# Patient Record
Sex: Female | Born: 1995 | Race: Black or African American | Hispanic: No | Marital: Single | Smoking: Never smoker
Health system: Southern US, Community
[De-identification: ages and names within clinical notes are randomized; demographics above are authoritative.]

## PROBLEM LIST (undated history)

## (undated) DIAGNOSIS — J45909 Unspecified asthma, uncomplicated: Secondary | ICD-10-CM

## (undated) HISTORY — PX: TONSILLECTOMY: SUR1361

---

## 2018-03-26 ENCOUNTER — Other Ambulatory Visit: Payer: Self-pay

## 2018-03-26 ENCOUNTER — Encounter (HOSPITAL_COMMUNITY): Payer: Self-pay | Admitting: Emergency Medicine

## 2018-03-26 ENCOUNTER — Emergency Department (HOSPITAL_COMMUNITY)
Admission: EM | Admit: 2018-03-26 | Discharge: 2018-03-26 | Disposition: A | Discharge status: Home / Self Care | Payer: Medicaid - Out of State | Attending: Emergency Medicine | Admitting: Emergency Medicine

## 2018-03-26 DIAGNOSIS — J45909 Unspecified asthma, uncomplicated: Secondary | ICD-10-CM | POA: Insufficient documentation

## 2018-03-26 DIAGNOSIS — D649 Anemia, unspecified: Secondary | ICD-10-CM | POA: Insufficient documentation

## 2018-03-26 DIAGNOSIS — M26603 Bilateral temporomandibular joint disorder, unspecified: Secondary | ICD-10-CM | POA: Diagnosis not present

## 2018-03-26 DIAGNOSIS — K0889 Other specified disorders of teeth and supporting structures: Secondary | ICD-10-CM

## 2018-03-26 HISTORY — DX: Unspecified asthma, uncomplicated: J45.909

## 2018-03-26 LAB — CBC WITH DIFFERENTIAL/PLATELET
ABS IMMATURE GRANULOCYTES: 0 10*3/uL (ref 0.0–0.1)
BASOS ABS: 0 10*3/uL (ref 0.0–0.1)
Basophils Relative: 1 %
Eosinophils Absolute: 0.6 10*3/uL (ref 0.0–0.7)
Eosinophils Relative: 10 %
HEMATOCRIT: 33.5 % — AB (ref 36.0–46.0)
Hemoglobin: 10.3 g/dL — ABNORMAL LOW (ref 12.0–15.0)
IMMATURE GRANULOCYTES: 0 %
LYMPHS PCT: 30 %
Lymphs Abs: 1.9 10*3/uL (ref 0.7–4.0)
MCH: 24 pg — ABNORMAL LOW (ref 26.0–34.0)
MCHC: 30.7 g/dL (ref 30.0–36.0)
MCV: 78.1 fL (ref 78.0–100.0)
Monocytes Absolute: 0.5 10*3/uL (ref 0.1–1.0)
Monocytes Relative: 7 %
NEUTROS ABS: 3.5 10*3/uL (ref 1.7–7.7)
NEUTROS PCT: 52 %
Platelets: 393 10*3/uL (ref 150–400)
RBC: 4.29 MIL/uL (ref 3.87–5.11)
RDW: 16.3 % — ABNORMAL HIGH (ref 11.5–15.5)
WBC: 6.5 10*3/uL (ref 4.0–10.5)

## 2018-03-26 LAB — T4, FREE: FREE T4: 0.78 ng/dL — AB (ref 0.82–1.77)

## 2018-03-26 LAB — TSH: TSH: 2.47 u[IU]/mL (ref 0.350–4.500)

## 2018-03-26 MED ORDER — PENICILLIN V POTASSIUM 500 MG PO TABS: 500.0000 mg | ORAL_TABLET | Freq: Three times a day (TID) | ORAL | 0 refills | Status: DC

## 2018-03-26 MED ORDER — ACETAMINOPHEN 325 MG PO TABS
650.0000 mg | ORAL_TABLET | ORAL | Status: DC | PRN
  Administered 2018-03-26: 650 mg via ORAL
  Filled 2018-03-26: qty 2

## 2018-03-26 NOTE — ED Provider Notes (Addendum)
[facility] EMERGENCY DEPARTMENT Provider Note   CSN: [id number] Arrival date & time: 03/26/18  4098  History   Chief Complaint Chief Complaint  Patient presents with  . Dental Pain   HPI  Debra Santana is a 22 y.o. female with a medical history of pseudoseizures and asthma who presented to the ED for intermittent dental pain. Patient states that pain started under her chin a few months ago, but has worsened over the past couple of weeks and is now present in her jaws bilaterally. She describes the pain as intermittent and throbbing. She states that it occasionally wakes her up at night and can radiate to her ears and cause a headache. Pain is relieved with ibuprofen. She took  ibuprofen prior to arrival to the ED. Patient has not been evaluated by another medical provider or dentist prior to coming to the ED.   Denies fever, chills, weight changes and heat/cold intolerance. Denies sore throat, dysphagia, difficulty speaking, drooling and halitosis. Endorses neck pain and fatigue. Denies past or family history of thyroid disease. Patient states that she has had asthma and past history of "psych related" seizures for which she does not take medications and does not follow a provider outpatient.   Patient lives in [place], [place] and will be following up there for medical care. She currently does not have a PCP or dentist.  The history is provided by the patient.  Dental Pain   This is a recurrent problem. Episode onset: a few months ago. The problem has been gradually worsening. The pain is at a severity of 8/10. The pain is moderate. She has tried rest, ice and heat for the symptoms. The treatment provided mild relief.    Past Medical History:  Diagnosis Date  . Asthma     Patient Active Problem List   Diagnosis Date Noted  . Odontalgia 03/26/2018     OB History   None      Home Medications    Prior to Admission medications   Not on File    Family  History No family history on file.  Social History Social History   Tobacco Use  . Smoking status: Never Smoker  . Smokeless tobacco: Never Used  Substance Use Topics  . Alcohol use: Not Currently    Frequency: Never  . Drug use: Not Currently     Allergies   Patient has no allergy information on record.   Review of Systems Review of Systems  Constitutional: Positive for appetite change and fever. Negative for activity change, chills, diaphoresis, fatigue and unexpected weight change.       Mostly eats soft foods because of pain associated with chewing. Endorses fever when she awakes at night.  HENT: Positive for dental problem, ear pain and facial swelling. Negative for drooling, hearing loss, mouth sores, sinus pressure, sinus pain, sore throat, trouble swallowing and voice change.   Eyes: Negative.  Negative for pain and visual disturbance.  Respiratory: Negative.  Negative for cough, choking, chest tightness, shortness of breath and wheezing.   Cardiovascular: Negative.   Endocrine: Negative.  Negative for cold intolerance and heat intolerance.  Skin: Negative.  Negative for rash and wound.  Neurological: Positive for headaches. Negative for seizures and speech difficulty.       Headaches when dental pain is at its worse.   Hematological: Negative.  Negative for adenopathy. Does not bruise/bleed easily.   Physical Exam Updated Vital Signs BP 126/62   Pulse 78  Temp 98.5 F (36.9 C) (Oral)   Ht  (1.854 m)   Wt 127 kg (280 lb)   LMP 03/12/2018 (Approximate)   SpO2 99%   BMI 36.94 kg/m   Physical Exam  Constitutional: She is oriented to person, place, and time. Vital signs are normal. She appears well-developed. She is cooperative.  Non-toxic appearance. She does not have a sickly appearance. No distress.  Obese.  HENT:  Head: Normocephalic and atraumatic.  Right Ear: Hearing, tympanic membrane, external ear and ear canal normal. No drainage, swelling or  tenderness. No mastoid tenderness. No middle ear effusion.  Left Ear: Hearing, tympanic membrane, external ear and ear canal normal. No drainage, swelling or tenderness. No mastoid tenderness.  No middle ear effusion.  Nose: Nose normal.  Mouth/Throat: Uvula is midline, oropharynx is clear and moist and mucous membranes are normal. No oral lesions. Normal dentition. No dental abscesses, uvula swelling or dental caries. No tonsillar exudate.  Tenderness to palpation in TMJ region. No visible lesions, dental caries or abscess seen.  Eyes: Pupils are equal, round, and reactive to light. Conjunctivae, EOM and lids are normal.  Neck: Trachea normal and phonation normal. Normal carotid pulses present. No tracheal tenderness present. Carotid bruit is not present. No tracheal deviation present.  Cardiovascular: Normal rate, regular rhythm and normal heart sounds.  Lymphadenopathy:       Left: Supraclavicular adenopathy present.  Single, mobile, tender supraclavicular lymph node noted on left side.  Neurological: She is alert and oriented to person, place, and time. She has normal strength. No cranial nerve deficit or sensory deficit.  Skin: Skin is warm, dry and intact.  Nursing note and vitals reviewed.   ED Treatments / Results  Labs (all labs ordered are listed, but only abnormal results are displayed) Labs Reviewed  CBC WITH DIFFERENTIAL/PLATELET - Abnormal; Notable for the following components:      Result Value   Hemoglobin 10.3 (*)    HCT 33.5 (*)    MCH 24.0 (*)    RDW 16.3 (*)    All other components within normal limits  T4, FREE - Abnormal; Notable for the following components:   Free T4 0.78 (*)    All other components within normal limits  TSH    EKG None  Radiology No results found.  Procedures Procedures (including critical care time)  Medications Ordered in ED Medications  acetaminophen (TYLENOL) tablet 650 mg (650 mg Oral Given 03/26/18 0748)    Initial  Impression / Assessment and Plan / ED Course  Triage vital signs and the nursing notes have been reviewed.  Pertinent labs & imaging results that were available during care of the patient were reviewed and considered in medical decision making (see chart for details).  Patient presents with intermittent, throbbing mouth pain. Physical exam does not show any signs of acute dental infections or structural defects. Patient's dentition is good without signs of caries, abscess or gum disease. Although teeth are mildly tender to palpation. She endorses that the pain radiates to her neck, jaw, ears and head and has awaken her at night. Endorses tenderness over TMJ and underneath jaws bilaterally on exam today. Endorses fever at night. Denies dysphagia, drooling, dyspnea or changes in speech. No signs or symptoms of temporal arteritis or Ludwig's angina.  CBC with differential ordered to evaluate for infection which showed anemia with hemoglobin of 10.3 with normal WBC. Patient has given Tylenol  for acute pain and experienced relief.   Final Clinical Impressions(s) /  ED Diagnoses   1. Unspecified Dental Pain. Recommend follow-up with PCP and/or dentist for continued treatment. Prophylactic PCN given. Patient can continue ibuprofen and/or acetaminophen for pain relief. Given dental resources in discharge paperwork. Patient educated on appropriate follow-up and pharm and non-pharm treatment.  2. Anemia. Hemoglobin 10.3. Denies lightheadedness, dizziness, melena/hematochezia and other signs of acute blood loss. Recommend follow-up with PCP for monitoring.  After thorough clinical evaluation, this patient is determined to be medically stable and can be safely discharged with the previously mentioned treatment and/or outpatient follow-up/referral(s). At this time, there are no other apparent medical conditions that require further screening, evaluation or treatment.  Final diagnoses:  Bilateral  temporomandibular joint pain    ED Discharge Orders    None       Reva Bores 03/26/18 1610    Windy Carina, PA-C 03/26/18 0857    Windy Carina, PA-C 03/26/18 [phone]

## 2018-03-26 NOTE — ED Triage Notes (Signed)
Pt arriving from home. She reports dental pain on left and right side and feels like her jaw is swollen.  Pt reports the pain has been going on for a few months but has progressively gotten worse in the last week. Pt took 800 mg of  ibuprofen before her arrival.

## 2018-03-26 NOTE — ED Provider Notes (Signed)
Medical screening examination/treatment/procedure(s) were conducted as a shared visit with non-physician practitioner(s) and myself.  I personally evaluated the patient during the encounter.  None  Patient is a 22 year old female who presents to the emergency department with dental pain mostly in bilateral lower teeth.  Feels like she has had some swelling underneath her jaw but no difficulty swallowing, speaking or breathing.  No enlargement of her thyroid.  Reports subjective fevers at home.  No tenderness over the TMJ, temporal artery.  No symptoms to suggest trigeminal neuralgia.  Afebrile here.  Actually well-appearing.  Normal voice.  No trismus or drooling.  Tender over the bilateral lower molars without obvious sign of dental caries or dental abscess.  Will discharge on prophylactic penicillin and recommend alternating Tylenol and Motrin for pain and give dental resources.   Amahri Dengel, Layla Maw, DO 03/26/18 [phone]

## 2018-03-26 NOTE — ED Notes (Signed)
Pt walked to bathroom 

## 2018-03-27 DIAGNOSIS — K0889 Other specified disorders of teeth and supporting structures: Secondary | ICD-10-CM | POA: Insufficient documentation

## 2018-03-27 NOTE — ED Triage Notes (Signed)
Pt reports dental pain on left lower side. She is tearful in triage. States that she has been taking ibuprofen all day, but is still in pain. Reports the pain has been there for a few weeks, but is worse today. A&Ox4.

## 2018-03-28 ENCOUNTER — Encounter (HOSPITAL_COMMUNITY): Payer: Self-pay

## 2018-03-28 ENCOUNTER — Emergency Department (HOSPITAL_COMMUNITY)
Admission: EM | Admit: 2018-03-28 | Discharge: 2018-03-28 | Disposition: A | Discharge status: Home / Self Care | Payer: Medicaid - Out of State | Attending: Emergency Medicine | Admitting: Emergency Medicine

## 2018-03-28 ENCOUNTER — Other Ambulatory Visit: Payer: Self-pay

## 2018-03-28 DIAGNOSIS — K0889 Other specified disorders of teeth and supporting structures: Secondary | ICD-10-CM

## 2018-03-28 MED ORDER — PENICILLIN V POTASSIUM 500 MG PO TABS: 500.0000 mg | ORAL_TABLET | Freq: Three times a day (TID) | ORAL | 0 refills | Status: AC

## 2018-03-28 MED ORDER — PENICILLIN V POTASSIUM 500 MG PO TABS
500.0000 mg | ORAL_TABLET | Freq: Once | ORAL | Status: AC
  Administered 2018-03-28: 500 mg via ORAL
  Filled 2018-03-28: qty 1

## 2018-03-28 MED ORDER — HYDROCODONE-ACETAMINOPHEN 5-325 MG PO TABS
1.0000 | ORAL_TABLET | Freq: Once | ORAL | Status: AC
  Administered 2018-03-28: 1 via ORAL
  Filled 2018-03-28: qty 1

## 2018-03-28 NOTE — ED Notes (Signed)
PA at bedside.

## 2018-03-28 NOTE — ED Provider Notes (Signed)
[facility]-EMERGENCY DEPT Provider Note   CSN: [id number] Arrival date & time: 03/27/18  2308     History   Chief Complaint No chief complaint on file.   HPI Debra Santana is a 22 y.o. female with a hx of asthma presents to the Emergency Department complaining of gradual, persistent, progressively worsening left lower dental pain onset approximately 8 PM tonight.  Patient reports the pain worsened around 10 PM.  She states she took Tylenol and ibuprofen without relief.  Patient reports she has not seen a dentist in many years.  She denies known dental caries.  Eating and drinking make her symptoms worse.  Nothing seems to make them better.  She denies headache, neck pain, chest pain, shortness of breath, abdominal pain, nausea, vomiting, diarrhea.  Record review shows that patient was seen on 03/26/2018 for similar symptoms.  At that time she was prescribed penicillin.  On further questioning, patient reports that she does not get paid until today and therefore was unable to fill her prescription.   The history is provided by the patient and medical records. No language interpreter was used.    Past Medical History:  Diagnosis Date  . Asthma     Patient Active Problem List   Diagnosis Date Noted  . Odontalgia 03/26/2018    Past Surgical History:  Procedure Laterality Date  . TONSILLECTOMY       OB History   None      Home Medications    Prior to Admission medications   Medication Sig Start Date End Date Taking? Authorizing Provider  penicillin v potassium (VEETID) 500 MG tablet Take 1 tablet (500 mg total) by mouth 3 (three) times daily for 5 days. 03/28/18 04/02/18  Danyka Merlin, Dahlia Client, PA-C    Family History History reviewed. No pertinent family history.  Social History Social History   Tobacco Use  . Smoking status: Never Smoker  . Smokeless tobacco: Never Used  Substance Use Topics  . Alcohol use: Not Currently    Frequency: Never    . Drug use: Not Currently     Allergies   Patient has no known allergies.   Review of Systems Review of Systems  Constitutional: Negative for appetite change, chills and fever.  HENT: Positive for dental problem. Negative for drooling, ear pain, facial swelling, nosebleeds, postnasal drip, rhinorrhea and trouble swallowing.   Eyes: Negative for pain and redness.  Respiratory: Negative for cough and wheezing.   Cardiovascular: Negative for chest pain.  Gastrointestinal: Negative for abdominal pain, nausea and vomiting.  Musculoskeletal: Negative for neck pain and neck stiffness.  Skin: Negative for color change and rash.  Neurological: Negative for weakness, light-headedness and headaches.  All other systems reviewed and are negative.    Physical Exam Updated Vital Signs BP 120/79 (BP Location: Right Arm)   Pulse 84   Temp 98.3 F (36.8 C)   Resp 17   LMP 03/12/2018 (Approximate)   SpO2 98%   Physical Exam  Constitutional: She appears well-developed and well-nourished.  HENT:  Head: Normocephalic.  Right Ear: Tympanic membrane, external ear and ear canal normal.  Left Ear: Tympanic membrane, external ear and ear canal normal.  Nose: Nose normal. Right sinus exhibits no maxillary sinus tenderness and no frontal sinus tenderness. Left sinus exhibits no maxillary sinus tenderness and no frontal sinus tenderness.  Mouth/Throat: Uvula is midline, oropharynx is clear and moist and mucous membranes are normal. No oral lesions. Abnormal dentition. Dental caries present. No uvula swelling  or lacerations. No oropharyngeal exudate, posterior oropharyngeal edema, posterior oropharyngeal erythema or tonsillar abscesses.  Left lower molar with tenderness to palpation. No gross abscess No fluctuance or induration to the buccal mucosa or floor of the mouth  Eyes: Pupils are equal, round, and reactive to light. Conjunctivae are normal. Right eye exhibits no discharge. Left eye exhibits no  discharge.  Neck: Normal range of motion. Neck supple.  No stridor Handling secretions without difficulty No nuchal rigidity No cervical lymphadenopathy  Cardiovascular: Normal rate and regular rhythm.  Pulmonary/Chest: Effort normal. No respiratory distress.  Equal chest rise  Abdominal: Soft. She exhibits no distension. There is no tenderness.  Lymphadenopathy:    She has no cervical adenopathy.  Neurological: She is alert.  Skin: Skin is warm and dry.  Psychiatric: She has a normal mood and affect.  Nursing note and vitals reviewed.    ED Treatments / Results   Procedures Procedures (including critical care time)  Medications Ordered in ED Medications  HYDROcodone-acetaminophen (NORCO/VICODIN) 5-325 MG per tablet 1 tablet (has no administration in time range)  penicillin v potassium (VEETID) tablet 500 mg (has no administration in time range)     Initial Impression / Assessment and Plan / ED Course  I have reviewed the triage vital signs and the nursing notes.  Pertinent labs & imaging results that were available during my care of the patient were reviewed by me and considered in my medical decision making (see chart for details).     Patient with toothache.  No gross abscess.  Exam unconcerning for Ludwig's angina or spread of infection.  Will treat with penicillin (repeat rx as she does not have the first one) and anti-inflammatories medicine.  Urged patient to follow-up with dentist.     Final Clinical Impressions(s) / ED Diagnoses   Final diagnoses:  Dentalgia    ED Discharge Orders        Ordered    penicillin v potassium (VEETID) 500 MG tablet  3 times daily     03/28/18 0459       Gumaro Brightbill, Dahlia Client, PA-C 03/28/18 0500    Zadie Rhine, MD 03/28/18 [phone]

## 2018-03-28 NOTE — Discharge Instructions (Addendum)
1. Medications: penicillin, usual home medications °2. Treatment: rest, drink plenty of fluids, take medications as prescribed °3. Follow Up: Please followup with dentistry within 1 week for discussion of your diagnoses and further evaluation after today's visit; if you do not have a primary care doctor use the resource guide provided to find one; Return to the ER for high fevers, difficulty breathing, difficulty swallowing or other concerning symptoms ° °

## 2018-04-09 ENCOUNTER — Emergency Department (HOSPITAL_COMMUNITY)
Admission: EM | Admit: 2018-04-09 | Discharge: 2018-04-09 | Disposition: A | Discharge status: Home / Self Care | Payer: Medicaid - Out of State | Attending: Emergency Medicine | Admitting: Emergency Medicine

## 2018-04-09 ENCOUNTER — Other Ambulatory Visit: Payer: Self-pay

## 2018-04-09 ENCOUNTER — Encounter (HOSPITAL_COMMUNITY): Payer: Self-pay | Admitting: *Deleted

## 2018-04-09 ENCOUNTER — Emergency Department (HOSPITAL_COMMUNITY): Payer: Medicaid - Out of State

## 2018-04-09 DIAGNOSIS — R0602 Shortness of breath: Secondary | ICD-10-CM

## 2018-04-09 DIAGNOSIS — K0889 Other specified disorders of teeth and supporting structures: Secondary | ICD-10-CM

## 2018-04-09 DIAGNOSIS — J45909 Unspecified asthma, uncomplicated: Secondary | ICD-10-CM | POA: Diagnosis not present

## 2018-04-09 LAB — CBC WITH DIFFERENTIAL/PLATELET
Abs Immature Granulocytes: 0 10*3/uL (ref 0.0–0.1)
Basophils Absolute: 0.1 10*3/uL (ref 0.0–0.1)
Basophils Relative: 1 %
Eosinophils Absolute: 0.5 10*3/uL (ref 0.0–0.7)
Eosinophils Relative: 9 %
HCT: 33.5 % — ABNORMAL LOW (ref 36.0–46.0)
Hemoglobin: 10.3 g/dL — ABNORMAL LOW (ref 12.0–15.0)
Immature Granulocytes: 0 %
Lymphocytes Relative: 36 %
Lymphs Abs: 2.1 10*3/uL (ref 0.7–4.0)
MCH: 23.7 pg — ABNORMAL LOW (ref 26.0–34.0)
MCHC: 30.7 g/dL (ref 30.0–36.0)
MCV: 77 fL — ABNORMAL LOW (ref 78.0–100.0)
Monocytes Absolute: 0.4 10*3/uL (ref 0.1–1.0)
Monocytes Relative: 8 %
Neutro Abs: 2.7 10*3/uL (ref 1.7–7.7)
Neutrophils Relative %: 46 %
Platelets: 410 10*3/uL — ABNORMAL HIGH (ref 150–400)
RBC: 4.35 MIL/uL (ref 3.87–5.11)
RDW: 15.9 % — ABNORMAL HIGH (ref 11.5–15.5)
WBC: 5.8 10*3/uL (ref 4.0–10.5)

## 2018-04-09 LAB — BASIC METABOLIC PANEL
Anion gap: 6 (ref 5–15)
BUN: 6 mg/dL (ref 6–20)
CO2: 24 mmol/L (ref 22–32)
Calcium: 9.1 mg/dL (ref 8.9–10.3)
Chloride: 107 mmol/L (ref 101–111)
Creatinine, Ser: 0.85 mg/dL (ref 0.44–1.00)
GFR calc Af Amer: >60 mL/min (ref >60)
GFR calc non Af Amer: >60 mL/min (ref >60)
Glucose, Bld: 86 mg/dL (ref 65–99)
Potassium: 3.9 mmol/L (ref 3.5–5.1)
Sodium: 137 mmol/L (ref 135–145)

## 2018-04-09 LAB — I-STAT BETA HCG BLOOD, ED (MC, WL, AP ONLY): I-stat hCG, quantitative: <5 m[IU]/mL (ref <5)

## 2018-04-09 MED ORDER — ALBUTEROL SULFATE HFA 108 (90 BASE) MCG/ACT IN AERS
1.0000 | INHALATION_SPRAY | Freq: Once | RESPIRATORY_TRACT | Status: AC
  Administered 2018-04-09: 1 via RESPIRATORY_TRACT
  Filled 2018-04-09: qty 6.7

## 2018-04-09 MED ORDER — KETOROLAC TROMETHAMINE 30 MG/ML IJ SOLN
30.0000 mg | Freq: Once | INTRAMUSCULAR | Status: AC
  Administered 2018-04-09: 30 mg via INTRAVENOUS
  Filled 2018-04-09: qty 1

## 2018-04-09 MED ORDER — IOHEXOL 300 MG/ML  SOLN
75.0000 mL | Freq: Once | INTRAMUSCULAR | Status: AC | PRN
  Administered 2018-04-09: 75 mL via INTRAVENOUS

## 2018-04-09 MED ORDER — IPRATROPIUM-ALBUTEROL 0.5-2.5 (3) MG/3ML IN SOLN
3.0000 mL | Freq: Once | RESPIRATORY_TRACT | Status: AC
  Administered 2018-04-09: 3 mL via RESPIRATORY_TRACT
  Filled 2018-04-09: qty 3

## 2018-04-09 MED ORDER — SODIUM CHLORIDE 0.9 % IV BOLUS
1000.0000 mL | Freq: Once | INTRAVENOUS | Status: AC
Start: 2018-04-09 — End: 2018-04-09
  Administered 2018-04-09: 1000 mL via INTRAVENOUS

## 2018-04-09 NOTE — ED Notes (Signed)
EDP states okay for patient to eat/drink - provided [place] sandwich and ice water.

## 2018-04-09 NOTE — ED Triage Notes (Signed)
Pt is here with 2 abscesses on the bilateral lower mouth and thinks it has spread to head and neck area.  Pt states it is making it hard for her to breath.  Pt is talking in complete sentences, pt states she wakes up in pain with some drooling.  Pt states she cannot taste or smell anything.  Pt states it has been this bad for a couple of weeks.  No active distress

## 2018-04-09 NOTE — ED Provider Notes (Signed)
[facility] EMERGENCY DEPARTMENT Provider Note   CSN: [id number] Arrival date & time: 04/09/18  1731     History   Chief Complaint No chief complaint on file.   HPI Debra Santana is a 22 y.o. female with history of asthma presents for evaluation of acute onset, personally worsening mandibular dental pain for 2 to 3 weeks.  She has been seen twice in the past for this, once on 03/26/2018 and again on 03/28/2018.  She initially could not afford the prescription of penicillin she was prescribed on the 15th but did fill it after the 17th and has completed the 7-day course she notes ongoing constant throbbing left-sided mandibular pain which has since radiated to the right and "all over my sinus ".  She notes a constant frontal throbbing headache for the past 2 weeks as well as intermittent sharp stabbing pain primarily behind the left eye but sometimes behind the right eye.  She also notes pain with movement of the left eye.  She denies vision changes.  She states that for the past 2 weeks she has had decreased sense of smell and taste.  She has nasal congestion at baseline due to seasonal allergies.  Over the past few days she has noted facial swelling of to the left side of the face under the jaw.  She denies dysphagia or diet aphasia but states that when she awakens in the morning she will sometimes drool.  For the past 3 to 4 days she endorses intermittent shortness of breath primarily with exertion.  She is unsure if she has been wheezing but states she has been out of her albuterol inhaler for several months.  Pain worsens with eating and drinking.  She denies fevers, throat tightness, voice change, or chest pain. She has also been trying warm compresses and Tylenol without significant relief of her symptoms.  She has an appointment scheduled with a dentist in [place] in 2 days.  The history is provided by the patient.    Past Medical History:  Diagnosis Date  . Asthma      Patient Active Problem List   Diagnosis Date Noted  . Odontalgia 03/26/2018    Past Surgical History:  Procedure Laterality Date  . TONSILLECTOMY       OB History   None      Home Medications    Prior to Admission medications   Not on File    Family History No family history on file.  Social History Social History   Tobacco Use  . Smoking status: Never Smoker  . Smokeless tobacco: Never Used  Substance Use Topics  . Alcohol use: Not Currently    Frequency: Never  . Drug use: Not Currently     Allergies   Patient has no known allergies.   Review of Systems Review of Systems  Constitutional: Negative for chills and fever.  HENT: Positive for congestion, dental problem, drooling and facial swelling. Negative for sore throat, trouble swallowing and voice change.   Eyes: Positive for pain. Negative for photophobia, discharge, redness and visual disturbance.  Respiratory: Positive for shortness of breath. Negative for cough.   Cardiovascular: Negative for chest pain.  Gastrointestinal: Negative for abdominal pain, nausea and vomiting.  Musculoskeletal: Positive for neck pain.  Neurological: Positive for headaches.  All other systems reviewed and are negative.    Physical Exam Updated Vital Signs BP 130/71 (BP Location: Right Arm)   Pulse 71   Temp 98.4 F (36.9 C) (Oral)  Resp 16   LMP 03/12/2018 (Approximate)   SpO2 99%   Physical Exam  Constitutional: She is oriented to person, place, and time. She appears well-developed and well-nourished. No distress.  HENT:  Head: Atraumatic.  Dentition appears stable.  Mild dental decay throughout but no fractured or missing dentition.  Mild gingival irritation with no observable abscess.  The patient has mild trismus but is able to open her mouth 2-3 finger widths.  No sublingual swelling or tenderness.  No posterior oropharyngeal erythema, tonsillar hypertrophy, exudates, or uvular deviation.  The  dentition is not tender to percussion.  TMs without erythema or bulging bilaterally.  Nasal septum is midline with mild mucosal edema bilaterally.  No maxillary sinus tenderness but bilateral frontal sinuses are tender to palpation.  Patient does have tenderness to palpation of the left orbit as well as the left mandible and the angle of the jaw.  She has mild left submandibular swelling with tenderness to palpation.  Eyes: Pupils are equal, round, and reactive to light. Conjunctivae are normal. Right eye exhibits no discharge. Left eye exhibits no discharge.  Patient endorses pain with EOMs of the left eye in every direction.  There is no conjunctival injection, chemosis, proptosis, or consensual photophobia.  No drainage.  No erythema or swelling of the eyelids.  The left orbit is tender to palpation generally.  Neck: Normal range of motion. Neck supple. No JVD present. No tracheal deviation present.  Pain is elicited with flexion of the neck and the left anterior aspect of the neck is tender to palpation.  No thyromegaly.  No upper airway stridor  Cardiovascular: Normal rate, regular rhythm, normal heart sounds and intact distal pulses.  Pulmonary/Chest: Effort normal.  Equal rise and fall of chest.  No increased work of breathing.  Globally diminished breath sounds with no wheezes on auscultation.  Speaking in full sentences without difficulty.  Abdominal: Soft. Bowel sounds are normal. She exhibits no distension. There is no tenderness. There is no guarding.  Musculoskeletal: She exhibits no edema.  Neurological: She is alert and oriented to person, place, and time. No cranial nerve deficit or sensory deficit. She exhibits normal muscle tone.  Skin: Skin is warm and dry. No erythema.  Psychiatric: She has a normal mood and affect. Her behavior is normal.  Nursing note and vitals reviewed.    ED Treatments / Results  Labs (all labs ordered are listed, but only abnormal results are  displayed) Labs Reviewed  CBC WITH DIFFERENTIAL/PLATELET - Abnormal; Notable for the following components:      Result Value   Hemoglobin 10.3 (*)    HCT 33.5 (*)    MCV 77.0 (*)    MCH 23.7 (*)    RDW 15.9 (*)    Platelets 410 (*)    All other components within normal limits  BASIC METABOLIC PANEL  I-STAT BETA HCG BLOOD, ED ([facility], [facility], [facility] ONLY)    EKG None  Radiology Ct Soft Tissue Neck W Contrast  Result Date: 04/09/2018 CLINICAL DATA:  Periorbital, mouth and neck abscess for months. Toothache. Difficulty breathing. History of tonsillectomy. EXAM: CT NECK WITH CONTRAST TECHNIQUE: Multidetector CT imaging of the neck was performed using the standard protocol following the bolus administration of intravenous contrast. CONTRAST:  75mL OMNIPAQUE IOHEXOL 300 MG/ML  SOLN COMPARISON:  None. FINDINGS: Large body habitus results in overall noisy image quality. PHARYNX AND LARYNX: Normal.  Widely patent airway. SALIVARY GLANDS: Normal. THYROID: Normal. LYMPH NODES: No lymphadenopathy by CT size  criteria. VASCULAR: Normal. LIMITED INTRACRANIAL: Empty sella. VISUALIZED ORBITS: Normal. MASTOIDS AND VISUALIZED PARANASAL SINUSES: Mild paranasal sinus mucosal thickening. SKELETON: Nonacute. Scattered dental caries without periapical abscess. UPPER CHEST: Lung apices are clear. Mild probable residual versus reactivated thymic tissue. No superior mediastinal lymphadenopathy. OTHER: None. IMPRESSION: 1. Habitus limited examination without acute process in the neck. 2. Scattered dental caries without periapical abscess. Electronically Signed   By: Awilda Metro M.D.   On: 04/09/2018 21:35    Procedures Procedures (including critical care time)  Medications Ordered in ED Medications  ipratropium-albuterol (DUONEB) 0.5-2.5 (3) MG/3ML nebulizer solution 3 mL (3 mLs Nebulization Given 04/09/18 1929)  sodium chloride 0.9 % bolus 1,000 mL (0 mLs Intravenous Stopped 04/09/18 2146)  ketorolac (TORADOL) 30 MG/ML  injection 30 mg (30 mg Intravenous Given 04/09/18 1937)  iohexol (OMNIPAQUE) 300 MG/ML solution 75 mL (75 mLs Intravenous Contrast Given 04/09/18 2053)  albuterol (PROVENTIL HFA;VENTOLIN HFA) 108 (90 Base) MCG/ACT inhaler 1 puff (1 puff Inhalation Given 04/09/18 2215)     Initial Impression / Assessment and Plan / ED Course  I have reviewed the triage vital signs and the nursing notes.  Pertinent labs & imaging results that were available during my care of the patient were reviewed by me and considered in my medical decision making (see chart for details).     Patient presents for evaluation of dental pain for the past 2 weeks.  She has been seen and evaluated for this twice before.  She is afebrile, vital signs are stable.  She is complaining of intermittent drooling and facial swelling but is tolerating secretions without difficulty and is protecting her airway.  There is no upper airway stridor.  She does have globally diminished breath sounds which improved significantly with DuoNeb.  On reevaluation the patient tells me she is feeling much better with regards to her shortness of breath.  I do suspect mild asthma exacerbation secondary to her upper airway symptoms.  The patient has been out of her albuterol inhaler and we will refill this for her.  With regards to her dental pain, the pain appears to radiate to the left side of the face and she has pain with movements of the right eye.  No fever or meningeal signs to suggest meningitis.  We will obtain CT scan to rule out deep space neck infection or orbital cellulitis.  Lab work reviewed by me shows no leukocytosis, mild stable anemia, and no electrolyte abnormalities.  CT scan shows no evidence of acute processes within the neck, scattered dental caries without periapical abscess.  No evidence of orbital cellulitis.  On reevaluation, the patient is resting comfortably no apparent distress.  She is eating a [place] sandwich and drinking a soda.  She  has no difficulty with this.  She does have follow-up scheduled with a dentist in 2 days.  Discussed conservative management with ibuprofen and Tylenol and encouraged the patient to follow-up with the PCP with regards to her asthma.  She has no increased work of breathing on my exam.  Encouraged the patient to keep her appointment with her dentist in 2 days.  Discussed strict ED return precautions. Pt verbalized understanding of and agreement with plan and is safe for discharge home at this time.   Final Clinical Impressions(s) / ED Diagnoses   Final diagnoses:  Pain, dental  SOB (shortness of breath)    ED Discharge Orders    None       Michela Pitcher A, PA-C 04/10/18 0111  Benjiman Core, MD 04/11/18 Marlyne Beards

## 2018-04-09 NOTE — ED Notes (Signed)
Patient verbalized understanding of discharge instructions and denies any further needs or questions at this time. VS stable. Patient ambulatory with steady gait.  

## 2018-04-09 NOTE — Discharge Instructions (Signed)
Alternate 600 mg of ibuprofen and [phone] mg of Tylenol every 3 hours as needed for pain. Do not exceed 4000 mg of Tylenol daily.  Take ibuprofen with food to avoid upset stomach issues.  Use your albuterol inhaler as needed for shortness of breath.  Follow-up with your dentist in 2 days as scheduled for reevaluation.  Return to the emergency department if any concerning signs or symptoms develop such as worsening facial swelling, high fevers, or difficulty breathing.

## 2018-07-21 ENCOUNTER — Ambulatory Visit (INDEPENDENT_AMBULATORY_CARE_PROVIDER_SITE_OTHER): Payer: Self-pay | Admitting: Physician Assistant

## 2018-07-21 ENCOUNTER — Encounter (INDEPENDENT_AMBULATORY_CARE_PROVIDER_SITE_OTHER): Payer: Self-pay | Admitting: Physician Assistant

## 2018-07-21 ENCOUNTER — Other Ambulatory Visit: Payer: Self-pay

## 2018-07-21 VITALS — BP 116/77 | HR 83 | Temp 98.8°F | Ht 73.0 in | Wt 348.0 lb

## 2018-07-21 DIAGNOSIS — R21 Rash and other nonspecific skin eruption: Secondary | ICD-10-CM

## 2018-07-21 DIAGNOSIS — Z23 Encounter for immunization: Secondary | ICD-10-CM

## 2018-07-21 MED ORDER — TRIAMCINOLONE ACETONIDE 0.5 % EX OINT: 1.0000 "application " | TOPICAL_OINTMENT | Freq: Two times a day (BID) | CUTANEOUS | 0 refills | Status: DC

## 2018-07-21 NOTE — Progress Notes (Signed)
Subjective:  Patient ID: Debra Santana, female    DOB: 03-19-1996  Age: 22 y.o. MRN: [id number]  CC: rash  HPI Debra Santana is a 22 y.o. female with a medical history of asthma presents as a new patient with complaint of rash in the antecubital fossa since approximately two years ago. Was seen by her PCP and has tried several creams to include steroids but none have been helpful. Was prescribed a pill that was initially helpful but was ineffective on subsequent refills. Does not remember the name to the pill. Says there is mild itching at times. Thinks she may have some similar lesions on the upper back but does not feel itching. Does not have any other symptoms or complaints.        Outpatient Medications Prior to Visit  Medication Sig Dispense Refill  . fluticasone (FLONASE) 50 MCG/ACT nasal spray Place 2 sprays into both nostrils daily.     No facility-administered medications prior to visit.      ROS Review of Systems  Constitutional: Negative for chills, fever and malaise/fatigue.  Eyes: Negative for blurred vision.  Respiratory: Negative for shortness of breath.   Cardiovascular: Negative for chest pain and palpitations.  Gastrointestinal: Negative for abdominal pain and nausea.  Genitourinary: Negative for dysuria and hematuria.  Musculoskeletal: Negative for joint pain and myalgias.  Skin: Positive for itching and rash.  Neurological: Negative for tingling and headaches.  Psychiatric/Behavioral: Negative for depression. The patient is not nervous/anxious.     Objective:  BP 116/77 (BP Location: Right Arm, Patient Position: Sitting, Cuff Size: Large)   Pulse 83   Temp 98.8 F (37.1 C) (Oral)   Ht 6\' 1"  (1.854 m)   Wt (!) 348 lb (157.9 kg)   LMP 06/25/2018 (Approximate)   SpO2 95%   BMI 45.91 kg/m   BP/Weight 07/21/2018 04/09/2018 03/28/2018  Systolic BP 116 130 134  Diastolic BP 77 71 80  Wt. (Lbs) 348 - -  BMI 45.91 - -      Physical Exam   Constitutional: She is oriented to person, place, and time.  Well developed, morbidly obese, NAD, polite  HENT:  Head: Normocephalic and atraumatic.  Eyes: No scleral icterus.  Neck: Normal range of motion. Neck supple. No thyromegaly present.  Cardiovascular: Normal rate, regular rhythm and normal heart sounds.  Pulmonary/Chest: Effort normal and breath sounds normal.  Musculoskeletal: She exhibits no edema.  Neurological: She is alert and oriented to person, place, and time.  Skin: Skin is warm and dry. No rash noted. No erythema. No pallor.  Right antecubital fossa with well circumscribed, rounded, hyperpigmented lesions containing overlying fine scale; no papules, edema, suppuration, or surrounding erythema. Left antecubital fossa with the same type of lesions but these lesions are converged.   Psychiatric: She has a normal mood and affect. Her behavior is normal. Thought content normal.  Vitals reviewed.    Assessment & Plan:   1. Rash and nonspecific skin eruption - Begin triamcinolone ointment (KENALOG) 0.5 %; Apply 1 application topically 2 (two) times daily.  Dispense: 30 g; Refill: 0 - Advised to apply for CAFA in case dermatology referral would be necessary.  2. Need for Tdap vaccination - Tdap vaccine greater than or equal to 7yo IM   Meds ordered this encounter  Medications  . triamcinolone ointment (KENALOG) 0.5 %    Sig: Apply 1 application topically 2 (two) times daily.    Dispense:  30 g    Refill:  0  Order Specific Question:   Supervising Provider    Answer:   Hoy Register [[id number]]    Follow-up: Return if symptoms worsen or fail to improve.   Loletta Specter PA

## 2018-07-21 NOTE — Patient Instructions (Addendum)
Triamcinolone skin cream, ointment, lotion, or aerosol What is this medicine? TRIAMCINOLONE (trye am SIN oh lone) is a corticosteroid. It is used on the skin to reduce swelling, redness, itching, and allergic reactions. This medicine may be used for other purposes; ask your health care provider or pharmacist if you have questions. COMMON BRAND NAME(S): Aristocort, Aristocort A, Aristocort HP, Cinalog, Cinolar, DERMASORB TA Complete, Flutex, Kenalog, Pediaderm TA, SP Rx 228, Triacet, Trianex, Triderm What should I tell my health care provider before I take this medicine? They need to know if you have any of these conditions: -diabetes -infection, like tuberculosis, herpes, or fungal infection -large areas of burned or damaged skin -skin wasting or thinning -an unusual or allergic reaction to triamcinolone, corticosteroids, other medicines, foods, dyes, or preservatives -pregnant or trying to get pregnant -breast-feeding How should I use this medicine? This medicine is for external use only. Do not take by mouth. Follow the directions on the prescription label. Wash your hands before and after use. Apply a thin film of medicine to the affected area. Do not cover with a bandage or dressing unless your doctor or health care professional tells you to. Do not use on healthy skin or over large areas of skin. Do not get this medicine in your eyes. If you do, rinse out with plenty of cool tap water. It is important not to use more medicine than prescribed. Do not use your medicine more often than directed. Talk to your pediatrician regarding the use of this medicine in children. Special care may be needed. Elderly patients are more likely to have damaged skin through aging, and this may increase side effects. This medicine should only be used for brief periods and infrequently in older patients. Overdosage: If you think you have taken too much of this medicine contact a poison control center or emergency  room at once. NOTE: This medicine is only for you. Do not share this medicine with others. What if I miss a dose? If you miss a dose, use it as soon as you can. If it is almost time for your next dose, use only that dose. Do not use double or extra doses. What may interact with this medicine? Interactions are not expected. This list may not describe all possible interactions. Give your health care provider a list of all the medicines, herbs, non-prescription drugs, or dietary supplements you use. Also tell them if you smoke, drink alcohol, or use illegal drugs. Some items may interact with your medicine. What should I watch for while using this medicine? Tell your doctor or health care professional if your symptoms do not start to get better within one week. Do not use for more than 14 days. Do not use on healthy skin or over large areas of skin. Tell your doctor or health care professional if you are exposed to anyone with measles or chickenpox, or if you develop sores or blisters that do not heal properly. Do not use an airtight bandage to cover the affected area unless your doctor or health care professional tells you to. If you are to cover the area, follow the instructions carefully. Covering the area where the medicine is applied can increase the amount that passes through the skin and increases the risk of side effects. If treating the diaper area of a child, avoid covering the treated area with tight-fitting diapers or plastic pants. This may increase the amount of medicine that passes through the skin and increase the risk of serious side   effects. What side effects may I notice from receiving this medicine? Side effects that you should report to your doctor or health care professional as soon as possible: -burning or itching of the skin -dark red spots on the skin -infection -painful, red, pus filled blisters in hair follicles -thinning of the skin, sunburn more likely especially on the  face Side effects that usually do not require medical attention (report to your doctor or health care professional if they continue or are bothersome): -dry skin, irritation -unusual increased growth of hair on the face or body This list may not describe all possible side effects. Call your doctor for medical advice about side effects. You may report side effects to FDA at 1-800-FDA-1088. Where should I keep my medicine? Keep out of the reach of children. Store at room temperature between 15 and 30 degrees C (59 and 86 degrees F). Do not freeze. Throw away any unused medicine after the expiration date. NOTE: This sheet is a summary. It may not cover all possible information. If you have questions about this medicine, talk to your doctor, pharmacist, or health care provider.  2018 Elsevier/Gold Standard (2014-02-18 15:59:51)   Td Vaccine (Tetanus and Diphtheria): What You Need to Know 1. Why get vaccinated? Tetanus  and diphtheria are very serious diseases. They are rare in the [place] today, but people who do become infected often have severe complications. Td vaccine is used to protect adolescents and adults from both of these diseases. Both tetanus and diphtheria are infections caused by bacteria. Diphtheria spreads from person to person through coughing or sneezing. Tetanus-causing bacteria enter the body through cuts, scratches, or wounds. TETANUS (lockjaw) causes painful muscle tightening and stiffness, usually all over the body.  It can lead to tightening of muscles in the head and neck so you can't open your mouth, swallow, or sometimes even breathe. Tetanus kills about 1 out of every 10 people who are infected even after receiving the best medical care.  DIPHTHERIA can cause a thick coating to form in the back of the throat.  It can lead to breathing problems, paralysis, heart failure, and death.  Before vaccines, as many as 200,000 cases of diphtheria and hundreds of cases of  tetanus were reported in the [place] each year. Since vaccination began, reports of cases for both diseases have dropped by about 99%. 2. Td vaccine Td vaccine can protect adolescents and adults from tetanus and diphtheria. Td is usually given as a booster dose every 10 years but it can also be given earlier after a severe and dirty wound or burn. Another vaccine, called Tdap, which protects against pertussis in addition to tetanus and diphtheria, is sometimes recommended instead of Td vaccine. Your doctor or the person giving you the vaccine can give you more information. Td may safely be given at the same time as other vaccines. 3. Some people should not get this vaccine  A person who has ever had a life-threatening allergic reaction after a previous dose of any tetanus or diphtheria containing vaccine, OR has a severe allergy to any part of this vaccine, should not get Td vaccine. Tell the person giving the vaccine about any severe allergies.  Talk to your doctor if you: ? had severe pain or swelling after any vaccine containing diphtheria or tetanus, ? ever had a condition called Guillain Barre Syndrome (GBS), ? aren't feeling well on the day the shot is scheduled. 4. What are the risks from Td vaccine? With any  medicine, including vaccines, there is a chance of side effects. These are usually mild and go away on their own. Serious reactions are also possible but are rare. Most people who get Td vaccine do not have any problems with it. Mild problems following Td vaccine: (Did not interfere with activities)  Pain where the shot was given (about 8 people in 10)  Redness or swelling where the shot was given (about 1 person in 4)  Mild fever (rare)  Headache (about 1 person in 4)  Tiredness (about 1 person in 4)  Moderate problems following Td vaccine: (Interfered with activities, but did not require medical attention)  Fever over 102F (rare)  Severe problems following Td  vaccine: (Unable to perform usual activities; required medical attention)  Swelling, severe pain, bleeding and/or redness in the arm where the shot was given (rare).  Problems that could happen after any vaccine:  People sometimes faint after a medical procedure, including vaccination. Sitting or lying down for about 15 minutes can help prevent fainting, and injuries caused by a fall. Tell your doctor if you feel dizzy, or have vision changes or ringing in the ears.  Some people get severe pain in the shoulder and have difficulty moving the arm where a shot was given. This happens very rarely.  Any medication can cause a severe allergic reaction. Such reactions from a vaccine are very rare, estimated at fewer than 1 in a million doses, and would happen within a few minutes to a few hours after the vaccination. As with any medicine, there is a very remote chance of a vaccine causing a serious injury or death. The safety of vaccines is always being monitored. For more information, visit: http://floyd.org/ 5. What if there is a serious reaction? What should I look for? Look for anything that concerns you, such as signs of a severe allergic reaction, very high fever, or unusual behavior. Signs of a severe allergic reaction can include hives, swelling of the face and throat, difficulty breathing, a fast heartbeat, dizziness, and weakness. These would usually start a few minutes to a few hours after the vaccination. What should I do?  If you think it is a severe allergic reaction or other emergency that can't wait, call 9-1-1 or get the person to the nearest hospital. Otherwise, call your doctor.  Afterward, the reaction should be reported to the Vaccine Adverse Event Reporting System (VAERS). Your doctor might file this report, or you can do it yourself through the VAERS web site at www.vaers.LAgents.no, or by calling 1-380-155-2875. ? VAERS does not give medical advice. 6. The National  Vaccine Injury Compensation Program The [employer] Vaccine Injury Compensation Program (VICP) is a federal program that was created to compensate people who may have been injured by certain vaccines. Persons who believe they may have been injured by a vaccine can learn about the program and about filing a claim by calling 1-6398326529 or visiting the VICP website at SpiritualWord.at. There is a time limit to file a claim for compensation. 7. How can I learn more?  Ask your doctor. He or she can give you the vaccine package insert or suggest other sources of information.  Call your local or state health department.  Contact the Centers for Disease Control and Prevention (CDC): ? Call (928)406-5203 (1-800-CDC-INFO) ? Visit CDC's website at PicCapture.uy CDC Td Vaccine VIS (02/21/16) This information is not intended to replace advice given to you by your health care provider. Make sure you discuss any questions you  have with your health care provider. Document Released: 08/26/2006 Document Revised: 07/19/2016 Document Reviewed: 07/19/2016 Elsevier Interactive Patient Education  2017 [employer].

## 2018-08-08 ENCOUNTER — Telehealth (INDEPENDENT_AMBULATORY_CARE_PROVIDER_SITE_OTHER): Payer: Self-pay | Admitting: Physician Assistant

## 2018-08-08 NOTE — Telephone Encounter (Signed)
Patient called requesting medication refill for   fluticasone (FLONASE) 50 MCG/ACT nasal spray   Please Advice  [phone]  Thank you Louisa Second

## 2018-08-12 ENCOUNTER — Other Ambulatory Visit (INDEPENDENT_AMBULATORY_CARE_PROVIDER_SITE_OTHER): Payer: Self-pay | Admitting: Physician Assistant

## 2018-08-12 MED ORDER — FLUTICASONE PROPIONATE 50 MCG/ACT NA SUSP: 2.0000 | Freq: Every day | NASAL | 1 refills | Status: DC

## 2018-08-12 NOTE — Telephone Encounter (Signed)
FWD to PCP. Audrie Kuri S Janaki Exley, CMA  

## 2018-08-12 NOTE — Telephone Encounter (Signed)
Refill sent to [facility] °

## 2018-09-11 ENCOUNTER — Other Ambulatory Visit: Payer: Self-pay

## 2018-09-11 ENCOUNTER — Emergency Department (HOSPITAL_COMMUNITY)
Admission: EM | Admit: 2018-09-11 | Discharge: 2018-09-11 | Disposition: A | Discharge status: Home / Self Care | Payer: Medicaid - Out of State | Attending: Emergency Medicine | Admitting: Emergency Medicine

## 2018-09-11 ENCOUNTER — Encounter (HOSPITAL_COMMUNITY): Payer: Self-pay

## 2018-09-11 DIAGNOSIS — Z79899 Other long term (current) drug therapy: Secondary | ICD-10-CM | POA: Insufficient documentation

## 2018-09-11 DIAGNOSIS — J069 Acute upper respiratory infection, unspecified: Secondary | ICD-10-CM

## 2018-09-11 DIAGNOSIS — J45909 Unspecified asthma, uncomplicated: Secondary | ICD-10-CM | POA: Diagnosis not present

## 2018-09-11 DIAGNOSIS — R51 Headache: Secondary | ICD-10-CM | POA: Diagnosis present

## 2018-09-11 NOTE — ED Provider Notes (Signed)
[facility] EMERGENCY DEPARTMENT Provider Note   CSN: [id number] Arrival date & time: 09/11/18  1439     History   Chief Complaint Chief Complaint  Patient presents with  . URI    HPI Debra Santana is a 22 y.o. female who presents with URI symptoms. PMH significant for asthma and allergies. She states her symptoms started yesterday. She reports nasal congestion and rhinorrhea as well as a throat scratchiness and odynophagia. No fever but has had chills. She has had a mild headache. She has been using Flonase for allergies. No ear pain, cough, vomiting. She is concerned that her symptoms may be due to mold in the place where she works.  HPI  Past Medical History:  Diagnosis Date  . Asthma     Patient Active Problem List   Diagnosis Date Noted  . Odontalgia 03/26/2018    Past Surgical History:  Procedure Laterality Date  . TONSILLECTOMY       OB History   None      Home Medications    Prior to Admission medications   Medication Sig Start Date End Date Taking? Authorizing Provider  fluticasone (FLONASE) 50 MCG/ACT nasal spray Place 2 sprays into both nostrils daily. 08/12/18   Loletta Specter, PA-C  triamcinolone ointment (KENALOG) 0.5 % Apply 1 application topically 2 (two) times daily. 07/21/18   Loletta Specter, PA-C    Family History No family history on file.  Social History Social History   Tobacco Use  . Smoking status: Never Smoker  . Smokeless tobacco: Never Used  Substance Use Topics  . Alcohol use: Not Currently    Frequency: Never  . Drug use: Not Currently     Allergies   Patient has no known allergies.   Review of Systems Review of Systems  Constitutional: Positive for chills. Negative for fever.  HENT: Positive for congestion, rhinorrhea and sore throat. Negative for ear pain.   Respiratory: Negative for cough.      Physical Exam Updated Vital Signs BP 127/84 (BP Location: Right Arm)   Pulse 92   Temp  98.6 F (37 C) (Oral)   Resp 16   SpO2 100%   Physical Exam  Constitutional: She is oriented to person, place, and time. She appears well-developed and well-nourished. No distress.  Calm and cooperative. Wearing big sunglasses  HENT:  Head: Normocephalic and atraumatic.  Right Ear: Hearing, tympanic membrane, external ear and ear canal normal.  Left Ear: Hearing, tympanic membrane, external ear and ear canal normal.  Nose: Mucosal edema present.  Mouth/Throat: Uvula is midline, oropharynx is clear and moist and mucous membranes are normal.  Eyes: Pupils are equal, round, and reactive to light. Conjunctivae are normal. Right eye exhibits no discharge. Left eye exhibits no discharge. No scleral icterus.  Neck: Normal range of motion.  Cardiovascular: Normal rate and regular rhythm.  Pulmonary/Chest: Effort normal and breath sounds normal. No respiratory distress.  Abdominal: She exhibits no distension.  Neurological: She is alert and oriented to person, place, and time.  Skin: Skin is warm and dry.  Psychiatric: She has a normal mood and affect. Her behavior is normal.  Nursing note and vitals reviewed.    ED Treatments / Results  Labs (all labs ordered are listed, but only abnormal results are displayed) Labs Reviewed - No data to display  EKG None  Radiology No results found.  Procedures Procedures (including critical care time)  Medications Ordered in ED Medications - No data  to display   Initial Impression / Assessment and Plan / ED Course  I have reviewed the triage vital signs and the nursing notes.  Pertinent labs & imaging results that were available during my care of the patient were reviewed by me and considered in my medical decision making (see chart for details).  22 year old with congestion and sore throat. She is concerned symptoms may be due to mold. She likely has a viral URI. Advised supportive care and allergy medicine.   Final Clinical  Impressions(s) / ED Diagnoses   Final diagnoses:  Upper respiratory tract infection, unspecified type    ED Discharge Orders    None       Bethel Born, PA-C 09/11/18 1531    Raeford Razor, MD 09/11/18 [phone]

## 2018-09-11 NOTE — ED Provider Notes (Signed)
Patient placed in Quick Look pathway, seen and evaluated   Chief Complaint: Nasal congestion  HPI:  Debra Santana is a 22 y.o. female who presents to the ED with sore throat and congestion that started 1 day ago. Patient reports her job has mold issues and she thinks she is reacting to that. Patient reports no cough.  ROS: ENT: nasal congestion, sore throat  Physical Exam:  BP 127/84 (BP Location: Right Arm)   Pulse 92   Temp 98.6 F (37 C) (Oral)   Resp 16   SpO2 100%    Gen: No distress  Neuro: Awake and Alert  Skin: Warm and dry  Lungs: clear  Heart: regular rate and rhythm  ENT: throat with mild erythema  Initiation of care has begun. The patient has been counseled on the process, plan, and necessity for staying for the completion/evaluation, and the remainder of the medical screening examination    Janne Napoleon, NP 09/11/18 1456    Lorre Nick, MD 09/12/18 [phone]

## 2018-09-11 NOTE — Discharge Instructions (Signed)
Please rest and drink plenty of fluids Use over the counter medicines for cold symptoms and allergies Please follow up with your doctor

## 2018-09-11 NOTE — ED Triage Notes (Signed)
Pt presents for evaluation of URI symptoms including nasal congestion, sore throat and chills x 1 day. Reports her job has a mold issue and she thinks she is reacting to it.

## 2018-11-15 ENCOUNTER — Emergency Department (HOSPITAL_COMMUNITY)
Admission: EM | Admit: 2018-11-15 | Discharge: 2018-11-15 | Disposition: A | Discharge status: Home / Self Care | Payer: Commercial Managed Care - PPO | Attending: Emergency Medicine | Admitting: Emergency Medicine

## 2018-11-15 ENCOUNTER — Encounter (HOSPITAL_COMMUNITY): Payer: Self-pay | Admitting: Emergency Medicine

## 2018-11-15 ENCOUNTER — Other Ambulatory Visit: Payer: Self-pay

## 2018-11-15 DIAGNOSIS — Z79899 Other long term (current) drug therapy: Secondary | ICD-10-CM | POA: Insufficient documentation

## 2018-11-15 DIAGNOSIS — J45909 Unspecified asthma, uncomplicated: Secondary | ICD-10-CM | POA: Diagnosis not present

## 2018-11-15 DIAGNOSIS — K0889 Other specified disorders of teeth and supporting structures: Secondary | ICD-10-CM | POA: Diagnosis present

## 2018-11-15 MED ORDER — PENICILLIN V POTASSIUM 500 MG PO TABS: 500.0000 mg | ORAL_TABLET | Freq: Four times a day (QID) | ORAL | 0 refills | Status: AC

## 2018-11-15 MED ORDER — BENZOCAINE 10 % MT GEL: 1.0000 "application " | OROMUCOSAL | 0 refills | Status: DC | PRN

## 2018-11-15 NOTE — ED Triage Notes (Signed)
Dental pain started last night, no obvious dental cavities- does grind teeth at night - pain over TMJ area.

## 2018-11-15 NOTE — Discharge Instructions (Signed)
Please take entire course of antibiotics as directed.  Continue using ibuprofen and Tylenol, as well as prescribed Orajel for pain.  You will need to follow-up with your dentist for continued management of this.  Return to the emergency department for fevers, swelling or pain under the tongue or in the neck, difficulty breathing or swallowing or any other new or concerning symptoms. 

## 2018-11-15 NOTE — ED Provider Notes (Signed)
[facility] EMERGENCY DEPARTMENT Provider Note   CSN: [id number] Arrival date & time: 11/15/18  1626     History   Chief Complaint Chief Complaint  Patient presents with  . Oral Swelling  . Dental Pain    HPI Debra Santana is a 23 y.o. female.  Debra Santana is a 23 y.o. female with a history of asthma, presents for evaluation of dental pain.  Reports pain over bilateral lower posterior molars.  Reports she has had issues with these teeth in the past, but pain became significantly more severe about 2 days ago.  She denies any difficulty swallowing or opening her mouth, no pain or swelling under the tongue.  No fevers or chills, no nausea or vomiting.  She has not noticed any drainage from the surrounding gums.  Reports she has an appointment with her dentist in 6 weeks, has not contacted them since pain worsened for sooner appointment.  Reports several months ago she was on antibiotics for similar pain and it seemed to help.     Past Medical History:  Diagnosis Date  . Asthma     Patient Active Problem List   Diagnosis Date Noted  . Odontalgia 03/26/2018    Past Surgical History:  Procedure Laterality Date  . TONSILLECTOMY       OB History   No obstetric history on file.      Home Medications    Prior to Admission medications   Medication Sig Start Date End Date Taking? Authorizing Provider  fluticasone (FLONASE) 50 MCG/ACT nasal spray Place 2 sprays into both nostrils daily. 08/12/18   Loletta Specter, PA-C  triamcinolone ointment (KENALOG) 0.5 % Apply 1 application topically 2 (two) times daily. 07/21/18   Loletta Specter, PA-C    Family History No family history on file.  Social History Social History   Tobacco Use  . Smoking status: Never Smoker  . Smokeless tobacco: Never Used  Substance Use Topics  . Alcohol use: Not Currently    Frequency: Never  . Drug use: Not Currently     Allergies   Patient has no known  allergies.   Review of Systems Review of Systems  Constitutional: Negative for chills and fever.  HENT: Positive for dental problem. Negative for drooling, facial swelling and trouble swallowing.   Gastrointestinal: Negative for nausea and vomiting.  Musculoskeletal: Negative for neck pain and neck stiffness.  Skin: Negative for color change and rash.     Physical Exam Updated Vital Signs BP (!) 142/80 (BP Location: Left Arm)   Pulse 83   Temp 97.9 F (36.6 C) (Oral)   Resp 16   Ht 6\' 1"  (1.854 m)   Wt 127 kg   LMP 11/09/2018   SpO2 100%   BMI 36.94 kg/m   Physical Exam Vitals signs and nursing note reviewed.  Constitutional:      General: She is not in acute distress.    Appearance: She is well-developed. She is not diaphoretic.  HENT:     Head: Normocephalic and atraumatic.     Mouth/Throat:      Comments: Tenderness over bilateral lower posterior molars with slight erythema of surrounding gums but no obvious abscess, posterior oropharynx is clear and moist, uvula midline, tolerating secretions and normal phonation, no trismus. Eyes:     General:        Right eye: No discharge.        Left eye: No discharge.  Neck:  Comments: No torticollis, no stridor, no palpable tenderness or masses.  Mild lymphadenopathy. Pulmonary:     Effort: Pulmonary effort is normal. No respiratory distress.  Neurological:     Mental Status: She is alert and oriented to person, place, and time. Mental status is at baseline.     Coordination: Coordination normal.  Psychiatric:        Mood and Affect: Mood normal.        Behavior: Behavior normal.      ED Treatments / Results  Labs (all labs ordered are listed, but only abnormal results are displayed) Labs Reviewed - No data to display  EKG None  Radiology No results found.  Procedures Procedures (including critical care time)  Medications Ordered in ED Medications - No data to display   Initial Impression /  Assessment and Plan / ED Course  I have reviewed the triage vital signs and the nursing notes.  Pertinent labs & imaging results that were available during my care of the patient were reviewed by me and considered in my medical decision making (see chart for details).  Patient with toothache.  No gross abscess.  Exam unconcerning for Ludwig's angina or spread of infection.  Will treat with penicillin and anti-inflammatories medicine.  Urged patient to follow-up with dentist.    Final Clinical Impressions(s) / ED Diagnoses   Final diagnoses:  Pain, dental    ED Discharge Orders         Ordered    penicillin v potassium (VEETID) 500 MG tablet  4 times daily     11/15/18 2020    benzocaine (ORAJEL) 10 % mucosal gel  As needed     11/15/18 2020           Dartha LodgeFord, Izyk Marty N, PA-C 11/15/18 2020    Maia PlanLong, Joshua G, MD 11/16/18 [phone]

## 2018-12-16 ENCOUNTER — Other Ambulatory Visit: Payer: Self-pay | Admitting: Family Medicine

## 2018-12-16 DIAGNOSIS — J309 Allergic rhinitis, unspecified: Secondary | ICD-10-CM

## 2018-12-16 MED ORDER — FLUTICASONE PROPIONATE 50 MCG/ACT NA SUSP: 2.0000 | Freq: Every day | NASAL | 0 refills | Status: DC

## 2018-12-16 NOTE — Progress Notes (Signed)
Patient ID: Debra Santana, female   DOB: 1996/10/29, 23 y.o.   MRN: [id number]   Refill request received for refill of Flonase nasal spray.  Patient last seen in the office on 07/21/2018.  We will send in medication with no additional refills and patient will need to schedule follow-up appointment.

## 2019-01-13 ENCOUNTER — Encounter (INDEPENDENT_AMBULATORY_CARE_PROVIDER_SITE_OTHER): Payer: Self-pay | Admitting: Primary Care

## 2019-01-13 ENCOUNTER — Other Ambulatory Visit: Payer: Self-pay

## 2019-01-13 ENCOUNTER — Ambulatory Visit (INDEPENDENT_AMBULATORY_CARE_PROVIDER_SITE_OTHER): Payer: Commercial Managed Care - PPO | Admitting: Primary Care

## 2019-01-13 VITALS — BP 117/70 | HR 87 | Temp 97.9°F | Ht 73.0 in | Wt 357.2 lb

## 2019-01-13 DIAGNOSIS — L309 Dermatitis, unspecified: Secondary | ICD-10-CM | POA: Diagnosis not present

## 2019-01-13 DIAGNOSIS — Z6841 Body Mass Index (BMI) 40.0 and over, adult: Secondary | ICD-10-CM | POA: Diagnosis not present

## 2019-01-13 DIAGNOSIS — T7840XA Allergy, unspecified, initial encounter: Secondary | ICD-10-CM

## 2019-01-13 MED ORDER — SODIUM CHLORIDE 0.9 % IN AERS: 1.0000 | INHALATION_SPRAY | RESPIRATORY_TRACT | 12 refills | Status: DC | PRN

## 2019-01-13 MED ORDER — CETIRIZINE HCL 10 MG PO TABS: 10.0000 mg | ORAL_TABLET | Freq: Every day | ORAL | 11 refills | Status: DC

## 2019-01-13 MED ORDER — FLUCONAZOLE 150 MG PO TABS: 150.0000 mg | ORAL_TABLET | ORAL | 0 refills | Status: AC

## 2019-01-13 NOTE — Progress Notes (Signed)
Established Patient Office Visit  Subjective:  Patient ID: Debra Santana, female    DOB: 02/16/1996  Age: 23 y.o. MRN: [id number]  CC:  Chief Complaint  Patient presents with  . Medication Refill    fluconazole and flonase     HPI Debra Santana presents for originally in for pap initially stated she was a virgin in confidence she asked if just the tip was put in does it break your hymen. She is concern with  Rash on. her arms that only responds to tx of antifungal cream   Past Medical History:  Diagnosis Date  . Asthma     Past Surgical History:  Procedure Laterality Date  . TONSILLECTOMY      History reviewed. No pertinent family history.  Social History   Socioeconomic History  . Marital status: Single    Spouse name: Not on file  . Number of children: Not on file  . Years of education: Not on file  . Highest education level: Not on file  Occupational History  . Not on file  Social Needs  . Financial resource strain: Not on file  . Food insecurity:    Worry: Not on file    Inability: Not on file  . Transportation needs:    Medical: Not on file    Non-medical: Not on file  Tobacco Use  . Smoking status: Never Smoker  . Smokeless tobacco: Never Used  Substance and Sexual Activity  . Alcohol use: Not Currently    Frequency: Never  . Drug use: Not Currently  . Sexual activity: Not Currently  Lifestyle  . Physical activity:    Days per week: Not on file    Minutes per session: Not on file  . Stress: Not on file  Relationships  . Social connections:    Talks on phone: Not on file    Gets together: Not on file    Attends religious service: Not on file    Active member of club or organization: Not on file    Attends meetings of clubs or organizations: Not on file    Relationship status: Not on file  . Intimate partner violence:    Fear of current or ex partner: Not on file    Emotionally abused: Not on file    Physically abused: Not on file   Forced sexual activity: Not on file  Other Topics Concern  . Not on file  Social History Narrative  . Not on file    Outpatient Medications Prior to Visit  Medication Sig Dispense Refill  . fluticasone (FLONASE) 50 MCG/ACT nasal spray Place 2 sprays into both nostrils daily. (Patient not taking: Reported on 01/13/2019) 16 g 0  . benzocaine (ORAJEL) 10 % mucosal gel Use as directed 1 application in the mouth or throat as needed for mouth pain. 5.3 g 0  . triamcinolone ointment (KENALOG) 0.5 % Apply 1 application topically 2 (two) times daily. 30 g 0   No facility-administered medications prior to visit.     No Known Allergies  ROS Review of Systems  Constitutional: Negative.   HENT: Negative.   Eyes: Negative.   Respiratory: Negative.   Cardiovascular: Negative.   Gastrointestinal: Negative.   Endocrine: Negative.   Genitourinary: Negative.   Musculoskeletal: Negative.   Skin: Positive for rash.  Allergic/Immunologic: Negative.   Neurological: Negative.   Hematological: Negative.   Psychiatric/Behavioral: Negative.       Objective:    Physical Exam  Constitutional: She is oriented  to person, place, and time. She appears well-developed and well-nourished.  HENT:  Head: Normocephalic.  Eyes: Pupils are equal, round, and reactive to light. EOM are normal.  Neck: Normal range of motion. Neck supple.  Cardiovascular: Normal rate and regular rhythm.  Pulmonary/Chest: Effort normal and breath sounds normal.  Abdominal: Soft. Bowel sounds are normal.  Musculoskeletal: Normal range of motion.  Neurological: She is alert and oriented to person, place, and time.  Skin: Skin is warm and dry. Ecchymosis and rash noted. Rash is papular and maculopapular.     Psychiatric: She has a normal mood and affect.    BP 117/70 (BP Location: Right Arm, Patient Position: Sitting, Cuff Size: Large)   Pulse 87   Temp 97.9 F (36.6 C) (Oral)   Ht 6\' 1"  (1.854 m)   Wt (!) 357 lb 3.2 oz  (162 kg)   LMP 01/07/2019 (Exact Date)   SpO2 94%   BMI 47.13 kg/m  Wt Readings from Last 3 Encounters:  01/13/19 (!) 357 lb 3.2 oz (162 kg)  11/15/18 280 lb (127 kg)  07/21/18 (!) 348 lb (157.9 kg)     Health Maintenance Due  Topic Date Due  . PAP-Cervical Cytology Screening  10/03/2017  . PAP SMEAR-Modifier  10/03/2017    There are no preventive care reminders to display for this patient.  Lab Results  Component Value Date   TSH 2.470 03/26/2018   Lab Results  Component Value Date   WBC 5.8 04/09/2018   HGB 10.3 (L) 04/09/2018   HCT 33.5 (L) 04/09/2018   MCV 77.0 (L) 04/09/2018   PLT 410 (H) 04/09/2018   Lab Results  Component Value Date   NA 137 04/09/2018   K 3.9 04/09/2018   CO2 24 04/09/2018   GLUCOSE 86 04/09/2018   BUN 6 04/09/2018   CREATININE 0.85 04/09/2018   CALCIUM 9.1 04/09/2018   ANIONGAP 6 04/09/2018      1. Eczema, unspecified type - Allergy Panel, [facility]  2. Morbid obesity (HCC) Diet modification and exercise   3. Allergic state, initial encounter Maintenance antihistamine   Assessment & Plan:       Follow-up: Return in about 6 months (around 07/16/2019), or if symptoms worsen or fail to improve, for morbid obesty .    Grayce Sessions, NP

## 2019-01-13 NOTE — Patient Instructions (Signed)
Preventing Sexually Transmitted Infections, Adult  Sexually transmitted infections (STIs) are diseases that are passed (transmitted) from person to person through bodily fluids exchanged during sex or sexual contact. Bodily fluids include saliva, semen, blood, vaginal mucus, and urine. You may have an increased risk for developing an STI if you have unprotected oral, vaginal, or anal sex.  Some common STIs include:  · Herpes.  · Hepatitis B.  · Chlamydia.  · Gonorrhea.  · Syphilis.  · HPV (human papillomavirus).  · HIV (human immunodeficiency virus), the virus that can cause AIDS (acquired immunodeficiency syndrome).  How can I protect myself from sexually transmitted infections?  The only way to completely prevent STIs is not to have sex of any kind (practice abstinence). This includes oral, vaginal, or anal sex. If you are sexually active, take these actions to lower your risk of getting an STI:  · Have only one sex partner (be monogamous) or limit the number of sexual partners you have.  · Stay up-to-date on immunizations. Certain vaccines can lower your risk of getting certain STIs, such as:  ? Hepatitis A and B vaccines. You may have been vaccinated as a young child, but likely need a booster shot as a teen or young adult.  ? HPV vaccine.  · Use methods that prevent the exchange of body fluids between partners (barrier protection) every time you have sex. Barrier protection can be used during oral, vaginal, or anal sex. Commonly used barrier methods include:  ? Female condom.  ? Female condom.  ? Dental dam.  · Get tested regularly for STIs. Have your sexual partner get tested regularly as well.  · Avoid mixing alcohol, drugs, and sex. Alcohol and drug use can affect your ability to make good decisions and can lead to risky sexual behaviors.  · Ask your health care provider about taking pre-exposure prophylaxis (PrEP) to prevent HIV infection if you:  ? Have a HIV-positive sexual partner.  ? Have multiple sexual  partners or partners who do not know their HIV status, and do not regularly use a condom during sex.  ? Use injection drugs and share needles.  Birth control pills, injections, implants, and intrauterine devices (IUDs) do not protect against STIs. To prevent both STIs and pregnancy, always use a condom with another form of birth control.  Some STIs, such as herpes, are spread through skin to skin contact. A condom does not protect you from getting such STIs. If you or your partner have herpes and there is an active flare with open sores, avoid all sexual contact.  Why are these changes important?  Taking steps to practice safe sex protects you and others. Many STIs can be cured. However, some STIs are not curable and will affect you for the rest of your life. STIs can be passed on to another person even if you do not have symptoms.  What can happen if changes are not made?  Certain STIs may:  · Require you to take medicine for the rest of your life.  · Affect your ability to have children (your fertility).  · Increase your risk for developing another STI or certain serious health conditions, such as:  ? Cervical cancer.  ? Head and neck cancer.  ? Pelvic inflammatory disease (PID) in women.  ? Organ damage or damage to other parts of your body, if the infection spreads.  · Be passed to a baby during childbirth.  How are sexually transmitted infections treated?  If you or   your partner know or think that you may have an STI:  · Talk with your health care provider about what can be done to treat it. Some STIs can be treated and cured with medicines.  · For curable STIs, you and your partner should avoid sex during treatment and for several days after treatment is complete.  · You and your partner should both be treated at the same time, if there is any chance that your partner is infected as well. If you get treatment but your partner does not, your partner can re-infect you when you resume sexual contact.  · Do not  have unprotected sex.  Where to find more information  Learn more about sexually transmitted diseases and infections from:  · Centers for Disease Control and Prevention:  ? More information about specific STIs: www.cdc.gov/std  ? Find places to get sexual health counseling and treatment for free or for a low cost: gettested.cdc.gov  · U.S. Department of Health and Human Services: www.womenshealth.gov/publications/our-publications/fact-sheet/sexually-transmitted-infections.html  Summary  · The only way to completely prevent STIs is not to have sex (practice abstinence), including oral, vaginal, or anal sex.  · STIs can spread through saliva, semen, blood, vaginal mucus, urine, or sexual contact.  · If you do have sex, limit your number of sexual partners and use a barrier protection method every time you have sex.  · If you develop an STI, get treated right away and ask your partner to be treated as well. Do not resume having sex until both of you have completed treatment for the STI.  This information is not intended to replace advice given to you by your health care provider. Make sure you discuss any questions you have with your health care provider.  Document Released: 10/25/2016 Document Revised: 04/04/2018 Document Reviewed: 10/25/2016  Elsevier Interactive Patient Education © 2019 [employer] Inc[employer]

## 2019-01-16 LAB — ALLERGY PANEL, ANIMAL GROUP
Cow Dander IgE: <0.1 kU/L
Goose Feathers IgE: <0.1 kU/L

## 2019-01-28 ENCOUNTER — Encounter (INDEPENDENT_AMBULATORY_CARE_PROVIDER_SITE_OTHER): Payer: Self-pay

## 2019-02-02 ENCOUNTER — Telehealth: Payer: Self-pay | Admitting: Primary Care

## 2019-02-02 ENCOUNTER — Telehealth (INDEPENDENT_AMBULATORY_CARE_PROVIDER_SITE_OTHER): Payer: Self-pay

## 2019-02-02 DIAGNOSIS — R21 Rash and other nonspecific skin eruption: Secondary | ICD-10-CM

## 2019-02-02 NOTE — Telephone Encounter (Signed)
This is not a long term medication over use can lead to liver problems. I will refer to dermatology

## 2019-02-02 NOTE — Telephone Encounter (Signed)
Patient called requesting a refill of Fluconazole sent to [facility]. Maryjean Morn, CMA

## 2019-02-02 NOTE — Telephone Encounter (Signed)
Patient is aware that refill is not appropriate as this medication used long term can cause liver problems. She is aware that referral has been made to dermatology. Maryjean Morn, CMA

## 2019-02-04 NOTE — Telephone Encounter (Signed)
Error

## 2019-03-14 ENCOUNTER — Emergency Department (HOSPITAL_COMMUNITY)
Admission: EM | Admit: 2019-03-14 | Discharge: 2019-03-14 | Disposition: A | Discharge status: Home / Self Care | Payer: Commercial Managed Care - PPO | Attending: Emergency Medicine | Admitting: Emergency Medicine

## 2019-03-14 ENCOUNTER — Emergency Department (HOSPITAL_COMMUNITY): Payer: Commercial Managed Care - PPO

## 2019-03-14 DIAGNOSIS — Z79899 Other long term (current) drug therapy: Secondary | ICD-10-CM | POA: Insufficient documentation

## 2019-03-14 DIAGNOSIS — R569 Unspecified convulsions: Secondary | ICD-10-CM

## 2019-03-14 DIAGNOSIS — J45909 Unspecified asthma, uncomplicated: Secondary | ICD-10-CM | POA: Insufficient documentation

## 2019-03-14 LAB — CBC WITH DIFFERENTIAL/PLATELET
Abs Immature Granulocytes: 0.01 10*3/uL (ref 0.00–0.07)
Basophils Absolute: 0 10*3/uL (ref 0.0–0.1)
Basophils Relative: 1 %
Eosinophils Absolute: 0.4 10*3/uL (ref 0.0–0.5)
Eosinophils Relative: 9 %
HCT: 37.5 % (ref 36.0–46.0)
Hemoglobin: 11.2 g/dL — ABNORMAL LOW (ref 12.0–15.0)
Immature Granulocytes: 0 %
Lymphocytes Relative: 42 %
Lymphs Abs: 1.9 10*3/uL (ref 0.7–4.0)
MCH: 24.6 pg — ABNORMAL LOW (ref 26.0–34.0)
MCHC: 29.9 g/dL — ABNORMAL LOW (ref 30.0–36.0)
MCV: 82.2 fL (ref 80.0–100.0)
Monocytes Absolute: 0.4 10*3/uL (ref 0.1–1.0)
Monocytes Relative: 10 %
Neutro Abs: 1.7 10*3/uL (ref 1.7–7.7)
Neutrophils Relative %: 38 %
Platelets: 358 10*3/uL (ref 150–400)
RBC: 4.56 MIL/uL (ref 3.87–5.11)
RDW: 16.3 % — ABNORMAL HIGH (ref 11.5–15.5)
WBC: 4.5 10*3/uL (ref 4.0–10.5)
nRBC: 0 % (ref 0.0–0.2)

## 2019-03-14 LAB — URINALYSIS, ROUTINE W REFLEX MICROSCOPIC
Bilirubin Urine: NEGATIVE
Glucose, UA: NEGATIVE mg/dL
Ketones, ur: NEGATIVE mg/dL
Leukocytes,Ua: NEGATIVE
Nitrite: NEGATIVE
Protein, ur: NEGATIVE mg/dL
RBC / HPF: >50 RBC/hpf — ABNORMAL HIGH (ref 0–5)
Specific Gravity, Urine: 1.004 — ABNORMAL LOW (ref 1.005–1.030)
pH: 7 (ref 5.0–8.0)

## 2019-03-14 LAB — COMPREHENSIVE METABOLIC PANEL
ALT: 14 U/L (ref 0–44)
AST: 17 U/L (ref 15–41)
Albumin: 3.6 g/dL (ref 3.5–5.0)
Alkaline Phosphatase: 70 U/L (ref 38–126)
Anion gap: 9 (ref 5–15)
BUN: 6 mg/dL (ref 6–20)
CO2: 22 mmol/L (ref 22–32)
Calcium: 9 mg/dL (ref 8.9–10.3)
Chloride: 107 mmol/L (ref 98–111)
Creatinine, Ser: 0.76 mg/dL (ref 0.44–1.00)
GFR calc Af Amer: >60 mL/min (ref >60)
GFR calc non Af Amer: >60 mL/min (ref >60)
Glucose, Bld: 92 mg/dL (ref 70–99)
Potassium: 3.7 mmol/L (ref 3.5–5.1)
Sodium: 138 mmol/L (ref 135–145)
Total Bilirubin: 0.4 mg/dL (ref 0.3–1.2)
Total Protein: 7.4 g/dL (ref 6.5–8.1)

## 2019-03-14 LAB — CBG MONITORING, ED: Glucose-Capillary: 76 mg/dL (ref 70–99)

## 2019-03-14 LAB — RAPID URINE DRUG SCREEN, HOSP PERFORMED
Amphetamines: NOT DETECTED
Barbiturates: NOT DETECTED
Benzodiazepines: POSITIVE — AB
Cocaine: NOT DETECTED
Opiates: NOT DETECTED
Tetrahydrocannabinol: NOT DETECTED

## 2019-03-14 LAB — LACTIC ACID, PLASMA: Lactic Acid, Venous: 1.3 mmol/L (ref 0.5–1.9)

## 2019-03-14 LAB — I-STAT BETA HCG BLOOD, ED (MC, WL, AP ONLY): I-stat hCG, quantitative: <5 m[IU]/mL (ref <5)

## 2019-03-14 MED ORDER — LORAZEPAM 2 MG/ML IJ SOLN
1.0000 mg | Freq: Once | INTRAMUSCULAR | Status: AC
  Administered 2019-03-14: 1 mg via INTRAVENOUS

## 2019-03-14 MED ORDER — LORAZEPAM 2 MG/ML IJ SOLN
INTRAMUSCULAR | Status: AC
  Administered 2019-03-14: 1 mg via INTRAVENOUS
  Filled 2019-03-14: qty 1

## 2019-03-14 MED ORDER — SODIUM CHLORIDE 0.9 % IV BOLUS
1000.0000 mL | Freq: Once | INTRAVENOUS | Status: AC
  Administered 2019-03-14: 1000 mL via INTRAVENOUS

## 2019-03-14 NOTE — Discharge Instructions (Signed)
Please follow-up with referred neurology office.  You should not drive a car, bathe/shower/swim alone until you are seen by a neurologist.  Return to the emergency department for any repeat seizure activity or any other worsening or concerning symptoms.

## 2019-03-14 NOTE — ED Notes (Signed)
Pt given water 

## 2019-03-14 NOTE — ED Notes (Signed)
Pt urinated on her own. In and Out cath NOT performed. Pt is alert and oriented X4

## 2019-03-14 NOTE — ED Notes (Addendum)
Pt awake, alert and oriented to person, place and time at present time.

## 2019-03-14 NOTE — ED Notes (Signed)
ED Provider at bedside. 

## 2019-03-14 NOTE — ED Triage Notes (Signed)
Pt BIB [facility]. Per EMS patient was seizing upon their arrival on scene. EMS reports patient was unresponsive to sternal rub during seizure. Pt given 5mg  of Versed IM by EMS. Pt became alert following Versed. EMS reports an additional seizure en route with 2.5mg  of Versed given IV for that incident. EMS spoke with patient's mom and per patient's mom she has a history of pseudoseizures.

## 2019-03-14 NOTE — ED Provider Notes (Signed)
[facility] Provider Note   CSN: [id number] Arrival date & time: 03/14/19  1450    History   Chief Complaint Chief Complaint  Patient presents with  . Seizures    HPI Debra Santana is a 23 y.o. female with PMH/o Asthma BIB EMS who presents for evaluation of possible seizure.  Per EMS, they were called out for possible seizure activity.  On EMS arrival, they report that patient was on the ground and was shaking.  They report she was unresponsive to sternal rub at that time.  They gave 5 mg of IM Versed which increase alertness.  Patient reports that during transport, she had an additional episode of shaking and was given additional 2.5 mg Versed IV.  EMS reported improvement after Versed.  On ED arrival, patient started shaking and I was called to the room.   EM LEVEL 5 CAVEAT DUE TO MENTAL STATUS CHANGE.      The history is provided by the patient.    Past Medical History:  Diagnosis Date  . Asthma     Patient Active Problem List   Diagnosis Date Noted  . Odontalgia 03/26/2018    Past Surgical History:  Procedure Laterality Date  . TONSILLECTOMY       OB History   No obstetric history on file.      Home Medications    Prior to Admission medications   Medication Sig Start Date End Date Taking? Authorizing Provider  cetirizine (ZYRTEC) 10 MG tablet Take 1 tablet (10 mg total) by mouth daily. 01/13/19   Grayce SessionsEdwards, Michelle P, NP  fluticasone (FLONASE) 50 MCG/ACT nasal spray Place 2 sprays into both nostrils daily. Patient not taking: Reported on 01/13/2019 12/16/18   Fulp, Cammie, MD  sodium chloride (BRONCHO SALINE) inhaler solution Take 1 spray by nebulization as needed. 01/13/19   Grayce SessionsEdwards, Michelle P, NP    Family History No family history on file.  Social History Social History   Tobacco Use  . Smoking status: Never Smoker  . Smokeless tobacco: Never Used  Substance Use Topics  . Alcohol use: Not Currently    Frequency:  Never  . Drug use: Not Currently     Allergies   Patient has no known allergies.   Review of Systems Review of Systems  Unable to perform ROS: Patient unresponsive     Physical Exam Updated Vital Signs BP (!) 151/133 (BP Location: Left Arm)   Pulse 74   Temp 98 F (36.7 C) (Axillary)   Resp 15   SpO2 100%   Physical Exam Vitals signs and nursing note reviewed.  Constitutional:      Appearance: Normal appearance. She is well-developed.  HENT:     Head: Normocephalic and atraumatic.     Comments: No tenderness to palpation of skull. No deformities or crepitus noted. No open wounds, abrasions or lacerations.  Eyes:     General: Lids are normal.     Conjunctiva/sclera: Conjunctivae normal.     Pupils: Pupils are equal, round, and reactive to light.     Comments: PERRL, EOMs intact   Neck:     Musculoskeletal: Full passive range of motion without pain.     Comments: Full flexion/extension and lateral movement of neck fully intact. No bony midline tenderness. No deformities or crepitus.   Cardiovascular:     Rate and Rhythm: Normal rate and regular rhythm.     Pulses: Normal pulses.  Radial pulses are 2+ on the right side and 2+ on the left side.     Heart sounds: Normal heart sounds. No murmur. No friction rub. No gallop.   Pulmonary:     Effort: Pulmonary effort is normal. No respiratory distress.     Breath sounds: Normal breath sounds.     Comments: Lungs clear to auscultation bilaterally.  Symmetric chest rise.  No wheezing, rales, rhonchi. Abdominal:     General: There is no distension.     Palpations: Abdomen is soft. Abdomen is not rigid.     Tenderness: There is no abdominal tenderness. There is no guarding or rebound.     Comments: Abdomen is soft, non-distended, non-tender. No rigidity, No guarding. No peritoneal signs.  Musculoskeletal: Normal range of motion.  Skin:    General: Skin is warm and dry.     Capillary Refill: Capillary refill takes  less than 2 seconds.  Neurological:     Mental Status: She is alert and oriented to person, place, and time.     Comments: Patient is tremulous and shaking with her head turned towards the right.  She protects her face when her arm is held up and dropped it to her side.  Additionally, her eyes turn towards snapping of fingers and then back to shaking. After Ativan, neuro exam improved and she was able to follow commands. Cranial nerves III-XII intact Follows commands, Moves all extremities  5/5 strength to BUE and BLE  Sensation intact throughout all major nerve distributions Normal coordination No slurred speech. No facial droop.   Psychiatric:        Speech: Speech normal.        Behavior: Behavior normal.      ED Treatments / Results  Labs (all labs ordered are listed, but only abnormal results are displayed) Labs Reviewed  CBC WITH DIFFERENTIAL/PLATELET - Abnormal; Notable for the following components:      Result Value   Hemoglobin 11.2 (*)    MCH 24.6 (*)    MCHC 29.9 (*)    RDW 16.3 (*)    All other components within normal limits  RAPID URINE DRUG SCREEN, HOSP PERFORMED - Abnormal; Notable for the following components:   Benzodiazepines POSITIVE (*)    All other components within normal limits  URINALYSIS, ROUTINE W REFLEX MICROSCOPIC - Abnormal; Notable for the following components:   Specific Gravity, Urine 1.004 (*)    Hgb urine dipstick LARGE (*)    RBC / HPF >50 (*)    Bacteria, UA RARE (*)    All other components within normal limits  COMPREHENSIVE METABOLIC PANEL  LACTIC ACID, PLASMA  CBG MONITORING, ED  I-STAT BETA HCG BLOOD, ED ([facility], [facility], [facility] ONLY)    EKG EKG Interpretation  Date/Time:  Saturday Mar 14 2019 14:59:39 EDT Ventricular Rate:  77 PR Interval:    QRS Duration: 99 QT Interval:  389 QTC Calculation: 441 R Axis:   42 Text Interpretation:  Sinus rhythm Artifact T wave abnormality Abnormal ekg Confirmed by Gerhard Munch [id number]) on 03/14/2019  3:06:26 PM   Radiology Ct Head Wo Contrast  Result Date: 03/14/2019 CLINICAL DATA:  Seizure activity today. EXAM: CT HEAD WITHOUT CONTRAST TECHNIQUE: Contiguous axial images were obtained from the base of the skull through the vertex without intravenous contrast. COMPARISON:  None. FINDINGS: Brain: There is no evidence of acute infarct, intracranial hemorrhage, mass, midline shift, or extra-axial fluid collection. The ventricles and sulci are normal. There is an expanded, partially  empty sella. Vascular: No hyperdense vessel. Skull: No fracture or focal osseous lesion. Sinuses/Orbits: Minimal mucosal thickening in the ethmoid air cells bilaterally. Minimal frothy material in the right sphenoid sinus. Clear mastoid air cells. Visualized orbits are unremarkable. Other: None. IMPRESSION: 1. No evidence of acute intracranial abnormality. 2. Partially empty sella. Electronically Signed   By: Sebastian Ache M.D.   On: 03/14/2019 16:59    Procedures Procedures (including critical care time)  Medications Ordered in ED Medications  sodium chloride 0.9 % bolus 1,000 mL (0 mLs Intravenous Stopped 03/14/19 1608)  LORazepam (ATIVAN) injection 1 mg (1 mg Intravenous Given 03/14/19 1515)     Initial Impression / Assessment and Plan / ED Course  I have reviewed the triage vital signs and the nursing notes.  Pertinent labs & imaging results that were available during my care of the patient were reviewed by me and considered in my medical decision making (see chart for details).        23 year old female who presents for evaluation of possible seizure activity.  EMS reports shaking on initial arrival.  They gave Versed which had improvement but then patient had another episode.  She required 7.5 mg of Versed total.  On ED arrival, she started engaging some shaking activity as well.  During this time, her eyes were focused to the right but she would turn towards the left when I snapped or made a noise in that  direction.  When I made a loud noise, she would direct her gaze towards a loud noise and then go back to shaking.  Additionally, she would protect her face when her hand was held up in the air I would drop her hand to the side.  Suspect pseudoseizure activity.  Patient was given 1 mg of Ativan with improvement.  Afterwards, she was able to tell me her name and was alert and oriented x3 and was able to follow commands.  At this time, given the distractibility component of her shaking activity as well as lack of generalized tonic-clonic activity, suspect this most likely pseudoseizure rather than true seizure activity.  Plan for labs.  Attempted to call mom but was unable to get an answer.  Lactic acid normal.  CBC without any significant leukocytosis.  Hemoglobin is 11.2.  CMP is unremarkable.  I-STAT beta is negative.  UDS is positive for benzos otherwise unremarkable.  UA shows large hemoglobin with RBC .  No other infectious etiology.  CT head negative for any acute abnormality.  Reevaluation.  Patient is more alert.  She is able to answer questions and is following commands.  She is alert and oriented x3.  She states she [place] neurolog[place] in [place] but states she last saw them about a year ago.  Patient states that she had an EEG done several years ago and states that there is no abnormalities.  Discussed patient with Dr. Laurence Slate (Neurology).  Given patient with history of pseudoseizure as well as distractible seizure that occurred in the ED, does not recommend any acute intervention.  Recommends giving seizure precautions to go home with.  Reevaluation.  Patient is alert, she is awake and has ambulated in the department without any difficulty.  She is answering questions appropriately.  She is back at baseline.  Discussed with patient regarding seizure precautions, including not driving, not bathing/showering/swimming by herself.  Will give outpatient neurology referral. At this time, patient  exhibits no emergent life-threatening condition that require further evaluation in ED or admission.  Patient had ample opportunity for questions and discussion. All patient's questions were answered with full understanding. Strict return precautions discussed. Patient expresses understanding and agreement to plan.   Portions of this note were generated with Scientist, clinical (histocompatibility and immunogenetics). Dictation errors may occur despite best attempts at proofreading.  Final Clinical Impressions(s) / ED Diagnoses   Final diagnoses:  Seizure-like activity [facility])    ED Discharge Orders    None       Rosana Hoes 03/14/19 2308    Gerhard Munch, MD 03/14/19 2352

## 2019-03-14 NOTE — ED Notes (Signed)
Pt ambulated to bathroom without difficulty.

## 2019-03-14 NOTE — ED Notes (Addendum)
Pt given discharge instructions and follow up information. Pt given the opportunity to ask questions. Pt verbalized understanding. Pt discharged from the ED without incident.  

## 2019-04-14 ENCOUNTER — Inpatient Hospital Stay: Payer: Commercial Managed Care - PPO | Admitting: Primary Care

## 2019-04-14 NOTE — Progress Notes (Deleted)
Virtual Visit via Telephone Note  I connected with Debra Santana on 04/14/19 at 11:10 AM EDT by telephone and verified that I am speaking with the correct person using two identifiers.   I discussed the limitations, risks, security and privacy concerns of performing an evaluation and management service by telephone and the availability of in person appointments. I also discussed with the patient that there may be a patient responsible charge related to this service. The patient expressed understanding and agreed to proceed.   History of Present Illness:    Observations/Objective:   Assessment and Plan:   Follow Up Instructions:    I discussed the assessment and treatment plan with the patient. The patient was provided an opportunity to ask questions and all were answered. The patient agreed with the plan and demonstrated an understanding of the instructions.   The patient was advised to call back or seek an in-person evaluation if the symptoms worsen or if the condition fails to improve as anticipated.  I provided *** minutes of non-face-to-face time during this encounter.   Grayce Sessions, NP

## 2019-04-21 ENCOUNTER — Inpatient Hospital Stay: Payer: Commercial Managed Care - PPO | Admitting: Primary Care

## 2019-04-28 ENCOUNTER — Inpatient Hospital Stay: Payer: Commercial Managed Care - PPO | Admitting: Primary Care

## 2019-05-05 ENCOUNTER — Encounter: Payer: Self-pay | Admitting: Primary Care

## 2019-05-05 ENCOUNTER — Ambulatory Visit: Payer: Commercial Managed Care - PPO | Attending: Primary Care | Admitting: Primary Care

## 2019-05-05 ENCOUNTER — Other Ambulatory Visit: Payer: Self-pay

## 2019-05-05 VITALS — BP 114/72 | HR 108 | Temp 98.2°F | Ht 73.0 in | Wt 362.0 lb

## 2019-05-05 DIAGNOSIS — L739 Follicular disorder, unspecified: Secondary | ICD-10-CM

## 2019-05-05 MED ORDER — DOXYCYCLINE HYCLATE 100 MG PO TABS: 100.0000 mg | ORAL_TABLET | Freq: Two times a day (BID) | ORAL | 0 refills | Status: DC

## 2019-05-05 MED ORDER — FLUCONAZOLE 150 MG PO TABS: 150.0000 mg | ORAL_TABLET | Freq: Once | ORAL | 0 refills | Status: AC

## 2019-05-05 NOTE — Patient Instructions (Signed)

## 2019-05-05 NOTE — Progress Notes (Signed)
Acute Office Visit  Subjective:    Patient ID: Debra Santana, female    DOB: 06/10/1996, 23 y.o.   MRN: [id number]  Chief Complaint  Patient presents with  . Follow-up    seizure like acivity  . foliculitis    HPI Patient is in today for concerns with folliculitis in her groin area that she feels is permately scared raised black bumps that are firmed no drainage. PMH: seizure like activity and obesity.  Past Medical History:  Diagnosis Date  . Asthma     Past Surgical History:  Procedure Laterality Date  . TONSILLECTOMY      No family history on file.  Social History   Socioeconomic History  . Marital status: Single    Spouse name: Not on file  . Number of children: Not on file  . Years of education: Not on file  . Highest education level: Not on file  Occupational History  . Not on file  Social Needs  . Financial resource strain: Not on file  . Food insecurity    Worry: Not on file    Inability: Not on file  . Transportation needs    Medical: Not on file    Non-medical: Not on file  Tobacco Use  . Smoking status: Never Smoker  . Smokeless tobacco: Never Used  Substance and Sexual Activity  . Alcohol use: Not Currently    Frequency: Never  . Drug use: Not Currently  . Sexual activity: Not Currently  Lifestyle  . Physical activity    Days per week: Not on file    Minutes per session: Not on file  . Stress: Not on file  Relationships  . Social Musicianconnections    Talks on phone: Not on file    Gets together: Not on file    Attends religious service: Not on file    Active member of club or organization: Not on file    Attends meetings of clubs or organizations: Not on file    Relationship status: Not on file  . Intimate partner violence    Fear of current or ex partner: Not on file    Emotionally abused: Not on file    Physically abused: Not on file    Forced sexual activity: Not on file  Other Topics Concern  . Not on file  Social History  Narrative  . Not on file    Outpatient Medications Prior to Visit  Medication Sig Dispense Refill  . cetirizine (ZYRTEC) 10 MG tablet Take 1 tablet (10 mg total) by mouth daily. 30 tablet 11  . sodium chloride (BRONCHO SALINE) inhaler solution Take 1 spray by nebulization as needed. 90 mL 12  . fluticasone (FLONASE) 50 MCG/ACT nasal spray Place 2 sprays into both nostrils daily. (Patient not taking: Reported on 01/13/2019) 16 g 0   No facility-administered medications prior to visit.     No Known Allergies  Review of Systems  Constitutional: Negative.   HENT: Negative.   Skin: Positive for rash.  All other systems reviewed and are negative.      Objective:    Physical Exam  Constitutional: She is oriented to person, place, and time. She appears well-developed and well-nourished.  Neck: Neck supple.  Abdominal: Bowel sounds are normal. She exhibits distension.  Genitourinary:    Genitourinary Comments: Folliculitis in groin area   Musculoskeletal: Normal range of motion.  Neurological: She is oriented to person, place, and time.  Skin: Skin is warm and dry.  Psychiatric: She has a normal mood and affect.    BP 114/72 (BP Location: Right Arm, Patient Position: Sitting, Cuff Size: Large)   Pulse (!) 108   Temp 98.2 F (36.8 C) (Oral)   Ht 6\' 1"  (1.854 m)   Wt (!) 362 lb (164.2 kg)   LMP 04/14/2019 (Approximate)   SpO2 97%   BMI 47.76 kg/m  Wt Readings from Last 3 Encounters:  05/05/19 (!) 362 lb (164.2 kg)  01/13/19 (!) 357 lb 3.2 oz (162 kg)  11/15/18 280 lb (127 kg)    Health Maintenance Due  Topic Date Due  . PAP-Cervical Cytology Screening  10/03/2017  . PAP SMEAR-Modifier  10/03/2017    There are no preventive care reminders to display for this patient.   Lab Results  Component Value Date   TSH 2.470 03/26/2018   Lab Results  Component Value Date   WBC 4.5 03/14/2019   HGB 11.2 (L) 03/14/2019   HCT 37.5 03/14/2019   MCV 82.2 03/14/2019   PLT 358  03/14/2019   Lab Results  Component Value Date   NA 138 03/14/2019   K 3.7 03/14/2019   CO2 22 03/14/2019   GLUCOSE 92 03/14/2019   BUN 6 03/14/2019   CREATININE 0.76 03/14/2019   BILITOT 0.4 03/14/2019   ALKPHOS 70 03/14/2019   AST 17 03/14/2019   ALT 14 03/14/2019   PROT 7.4 03/14/2019   ALBUMIN 3.6 03/14/2019   CALCIUM 9.0 03/14/2019   ANIONGAP 9 03/14/2019   No results found for: CHOL No results found for: HDL No results found for: LDLCALC No results found for: TRIG No results found for: CHOLHDL No results found for: HGBA1C     Assessment & Plan:   Problem List Items Addressed This Visit    None    [place] was seen today for follow-up and foliculitis.  Diagnoses and all orders for this visit:  Chronic folliculitis In groin area she was previously shaving and developed in groin hair with raised bumps. Patient squeeed the raised bumps and pus or white color would ooze out. Now the areas are dark and firm bumps will tx with   -     doxycycline (VIBRA-TABS) 100 MG tablet; Take 1 tablet (100 mg total) by mouth 2 (two) times daily. -     fluconazole (DIFLUCAN) 150 MG tablet; Take 1 tablet (150 mg total) by mouth once for 1 dose.    Kerin Perna, NP

## 2019-06-01 ENCOUNTER — Telehealth (INDEPENDENT_AMBULATORY_CARE_PROVIDER_SITE_OTHER): Payer: Self-pay

## 2019-06-01 NOTE — Telephone Encounter (Signed)
Patient called to notify PCP that antibiotic did not work. Would like referral to dermatology as discussed with PCP at last office visit. Nat Christen, CMA

## 2019-06-02 ENCOUNTER — Other Ambulatory Visit (INDEPENDENT_AMBULATORY_CARE_PROVIDER_SITE_OTHER): Payer: Self-pay | Admitting: Primary Care

## 2019-06-02 DIAGNOSIS — R21 Rash and other nonspecific skin eruption: Secondary | ICD-10-CM

## 2019-06-02 NOTE — Telephone Encounter (Signed)
Dermatology referral completed 

## 2019-06-02 NOTE — Telephone Encounter (Signed)
Left voicemail asking patient to return call to [facility] at [phone]. Terilynn Buresh S Eveleen Mcnear, CMA  

## 2019-06-03 NOTE — Telephone Encounter (Signed)
Patient returned call. She is aware that referral has been placed to dermatology and their office will contact her directly to schedule.Nat Christen, CMA

## 2019-07-21 ENCOUNTER — Ambulatory Visit (INDEPENDENT_AMBULATORY_CARE_PROVIDER_SITE_OTHER): Payer: Commercial Managed Care - PPO | Admitting: Primary Care

## 2019-08-18 ENCOUNTER — Telehealth (INDEPENDENT_AMBULATORY_CARE_PROVIDER_SITE_OTHER): Payer: Self-pay

## 2019-08-18 NOTE — Telephone Encounter (Signed)
Patient called to make a medication refill for  fluticasone (FLONASE) 50 MCG/ACT nasal spray   Patient states the pills are not helping.  Patient uses [facility]    Please advice [phone]

## 2019-08-19 ENCOUNTER — Other Ambulatory Visit (INDEPENDENT_AMBULATORY_CARE_PROVIDER_SITE_OTHER): Payer: Self-pay | Admitting: Primary Care

## 2019-08-19 MED ORDER — FLUTICASONE PROPIONATE 50 MCG/ACT NA SUSP: 2.0000 | Freq: Every day | NASAL | 0 refills | Status: DC

## 2019-08-19 NOTE — Telephone Encounter (Signed)
dont see in patients current medication list. Please send if appropriate. Nat Christen, CMA

## 2019-10-06 ENCOUNTER — Other Ambulatory Visit: Payer: Self-pay

## 2019-10-06 ENCOUNTER — Encounter (INDEPENDENT_AMBULATORY_CARE_PROVIDER_SITE_OTHER): Payer: Self-pay | Admitting: Primary Care

## 2019-10-06 ENCOUNTER — Ambulatory Visit (INDEPENDENT_AMBULATORY_CARE_PROVIDER_SITE_OTHER): Payer: Commercial Managed Care - PPO | Admitting: Primary Care

## 2019-10-06 VITALS — BP 115/82 | HR 97 | Temp 98.2°F | Resp 20 | Wt 378.2 lb

## 2019-10-06 DIAGNOSIS — E669 Obesity, unspecified: Secondary | ICD-10-CM | POA: Diagnosis not present

## 2019-10-06 DIAGNOSIS — Z01411 Encounter for gynecological examination (general) (routine) with abnormal findings: Secondary | ICD-10-CM | POA: Diagnosis not present

## 2019-10-06 DIAGNOSIS — N898 Other specified noninflammatory disorders of vagina: Secondary | ICD-10-CM

## 2019-10-06 DIAGNOSIS — Z124 Encounter for screening for malignant neoplasm of cervix: Secondary | ICD-10-CM

## 2019-10-06 DIAGNOSIS — Z6841 Body Mass Index (BMI) 40.0 and over, adult: Secondary | ICD-10-CM

## 2019-10-06 NOTE — Progress Notes (Signed)
Established Patient Office Visit  Subjective:  Patient ID: Debra Santana, female    DOB: [birth year]  Age: 23 y.o. MRN: [id number]  CC:  Chief Complaint  Patient presents with  . Gynecologic Exam    HPI Debra Santana presents for gyn exam.  Past Medical History:  Diagnosis Date  . Asthma     Past Surgical History:  Procedure Laterality Date  . TONSILLECTOMY      History reviewed. No pertinent family history.  Social History   Socioeconomic History  . Marital status: Single    Spouse name: Not on file  . Number of children: Not on file  . Years of education: Not on file  . Highest education level: Not on file  Occupational History  . Not on file  Social Needs  . Financial resource strain: Not on file  . Food insecurity    Worry: Not on file    Inability: Not on file  . Transportation needs    Medical: Not on file    Non-medical: Not on file  Tobacco Use  . Smoking status: Never Smoker  . Smokeless tobacco: Never Used  Substance and Sexual Activity  . Alcohol use: Not Currently    Frequency: Never  . Drug use: Not Currently  . Sexual activity: Not Currently  Lifestyle  . Physical activity    Days per week: Not on file    Minutes per session: Not on file  . Stress: Not on file  Relationships  . Social Musician on phone: Not on file    Gets together: Not on file    Attends religious service: Not on file    Active member of club or organization: Not on file    Attends meetings of clubs or organizations: Not on file    Relationship status: Not on file  . Intimate partner violence    Fear of current or ex partner: Not on file    Emotionally abused: Not on file    Physically abused: Not on file    Forced sexual activity: Not on file  Other Topics Concern  . Not on file  Social History Narrative  . Not on file    Outpatient Medications Prior to Visit  Medication Sig Dispense Refill  . fluticasone (FLONASE) 50 MCG/ACT nasal spray Place  2 sprays into both nostrils daily. 16 g 0  . cetirizine (ZYRTEC) 10 MG tablet Take 1 tablet (10 mg total) by mouth daily. 30 tablet 11  . doxycycline (VIBRA-TABS) 100 MG tablet Take 1 tablet (100 mg total) by mouth 2 (two) times daily. 20 tablet 0  . sodium chloride (BRONCHO SALINE) inhaler solution Take 1 spray by nebulization as needed. 90 mL 12   No facility-administered medications prior to visit.     No Known Allergies  ROS Review of Systems    Objective:    Physical Exam  BP 115/82   Pulse 97   Temp 98.2 F (36.8 C) (Oral)   Resp 20   Wt (!) 378 lb 3.2 oz (171.6 kg)   LMP 09/29/2019 (Exact Date)   SpO2 99%   BMI 49.90 kg/m  Wt Readings from Last 3 Encounters:  10/06/19 (!) 378 lb 3.2 oz (171.6 kg)  05/05/19 (!) 362 lb (164.2 kg)  01/13/19 (!) 357 lb 3.2 oz (162 kg)     Health Maintenance Due  Topic Date Due  . HIV Screening  10/04/2011  . INFLUENZA VACCINE  06/13/2019  There are no preventive care reminders to display for this patient.  Lab Results  Component Value Date   TSH 2.470 03/26/2018   Lab Results  Component Value Date   WBC 4.5 03/14/2019   HGB 11.2 (L) 03/14/2019   HCT 37.5 03/14/2019   MCV 82.2 03/14/2019   PLT 358 03/14/2019   Lab Results  Component Value Date   NA 138 03/14/2019   K 3.7 03/14/2019   CO2 22 03/14/2019   GLUCOSE 92 03/14/2019   BUN 6 03/14/2019   CREATININE 0.76 03/14/2019   BILITOT 0.4 03/14/2019   ALKPHOS 70 03/14/2019   AST 17 03/14/2019   ALT 14 03/14/2019   PROT 7.4 03/14/2019   ALBUMIN 3.6 03/14/2019   CALCIUM 9.0 03/14/2019   ANIONGAP 9 03/14/2019   No results found for: CHOL No results found for: HDL No results found for: LDLCALC No results found for: TRIG No results found for: CHOLHDL No results found for: HGBA1C    Assessment & Plan:  CONSTITUTIONAL: Well-developed, well-nourished female in no acute distress.  HENT:  Normocephalic, atraumatic, External right and left ear normal. Oropharynx  is clear and moist EYES: Conjunctivae and EOM are normal. Pupils are equal, round, and reactive to light. No scleral icterus.  NECK: Normal range of motion, supple, no masses.  Normal thyroid.  SKIN: Skin is warm and dry. No rash noted. Not diaphoretic. No erythema. No pallor. [place]: Alert and oriented to person, place, and time. Normal reflexes, muscle tone coordination. No cranial nerve deficit noted. PSYCHIATRIC: Normal mood and affect. Normal behavior. Normal judgment and thought content. CARDIOVASCULAR: Normal heart rate noted, regular rhythm RESPIRATORY: Clear to auscultation bilaterally. Effort and breath sounds normal, no problems with respiration noted. BREASTS:Taught SBE ABDOMEN: Soft, normal bowel sounds, no distention noted.  No tenderness, rebound or guarding.  PELVIC: Normal appearing external genitalia; normal appearing vaginal mucosa and cervix.  No abnormal discharge noted.  Pap smear obtained.  Normal uterine size, no other palpable masses, no uterine or adnexal tenderness. MUSCULOSKELETAL: Normal range of motion. No tenderness.  No cyanosis, clubbing, or edema.  2+ distal pulses.  .  Diagnoses and all orders for this visit:  Vaginal discharge -     Cytology - PAP([place]) Cervical cancer screening and sexually transmitted disease   No orders of the defined types were placed in this encounter.   Follow-up: Return if symptoms worsen or fail to improve.    Kerin Perna, NP

## 2019-10-06 NOTE — Progress Notes (Signed)
PAP

## 2019-10-06 NOTE — Patient Instructions (Signed)
The USPSTF recommendations screening of cervical cancer every 3 years with cervical cytology.  All women age 23 to 65 years are at risk for cervical cancer because of potential exposure to high risk HPV types to sexual intercourse and should be screened.  Certainly risk factors further increased risk for cervical cancer including HIV infection, a compromised immune system, and utero exposure to diethylstilbestrol and previous treatment of high-grade precancerous lesions or cervical cancer.  Women with these risk factors should receive individual follow-up 

## 2019-10-07 ENCOUNTER — Other Ambulatory Visit (HOSPITAL_COMMUNITY)
Admission: RE | Admit: 2019-10-07 | Discharge: 2019-10-07 | Disposition: A | Discharge status: Home / Self Care | Payer: Commercial Managed Care - PPO | Source: Ambulatory Visit | Attending: Primary Care | Admitting: Primary Care

## 2019-10-07 DIAGNOSIS — N898 Other specified noninflammatory disorders of vagina: Secondary | ICD-10-CM | POA: Diagnosis not present

## 2019-10-15 LAB — CYTOLOGY - PAP
Chlamydia: NEGATIVE
Comment: NEGATIVE
Comment: NEGATIVE
Comment: NORMAL
Diagnosis: NEGATIVE
HSV1: NEGATIVE
HSV2: NEGATIVE
Neisseria Gonorrhea: NEGATIVE
Trichomonas: NEGATIVE

## 2019-10-16 ENCOUNTER — Telehealth (INDEPENDENT_AMBULATORY_CARE_PROVIDER_SITE_OTHER): Payer: Self-pay

## 2019-10-16 NOTE — Telephone Encounter (Signed)
Patient is aware that pap is normal. Debra Santana, CMA  

## 2019-10-16 NOTE — Telephone Encounter (Signed)
-----   Message from Kerin Perna, NP sent at 10/15/2019  1:54 PM EST ----- Pap normal

## 2019-11-16 ENCOUNTER — Telehealth (INDEPENDENT_AMBULATORY_CARE_PROVIDER_SITE_OTHER): Payer: Self-pay

## 2019-11-16 MED ORDER — FLUTICASONE PROPIONATE 50 MCG/ACT NA SUSP: 2.0000 | Freq: Every day | NASAL | 0 refills | Status: DC

## 2019-11-16 NOTE — Telephone Encounter (Signed)
Patient called to make a medication refill for  fluticasone [facility]) 50 MCG/ACT nasal spray    Patient uses [facility] [address], [place] - [address]  [address] [place] [address]    Please advice [phone]

## 2019-11-16 NOTE — Telephone Encounter (Signed)
Refill has been sent.  °

## 2020-04-10 IMAGING — CT CT HEAD WITHOUT CONTRAST
4 series · 16 of 47 positions shown, 18 images · non-contrast
Comparison: None.

CLINICAL DATA: Seizure activity today.

EXAM:
CT HEAD WITHOUT CONTRAST
TECHNIQUE: Contiguous axial images were obtained from the base of the skull
through the vertex without intravenous contrast.

[Series 3: head without · axial · non-contrast · 0.42mm/px · z∈[-164,-49]mm · 7 of 31 slices shown, 9 images]
[im 4/31  brain]
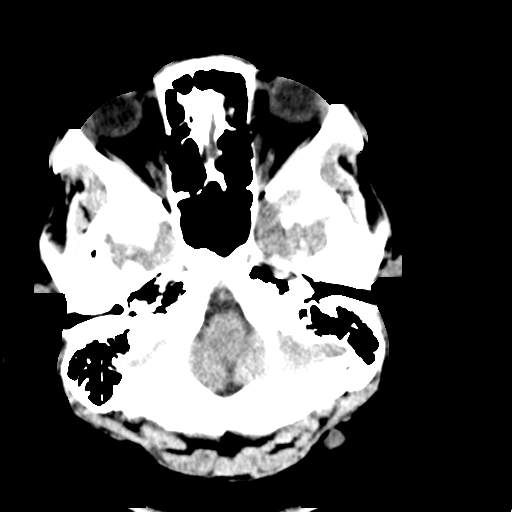
[im 4/31  bone]
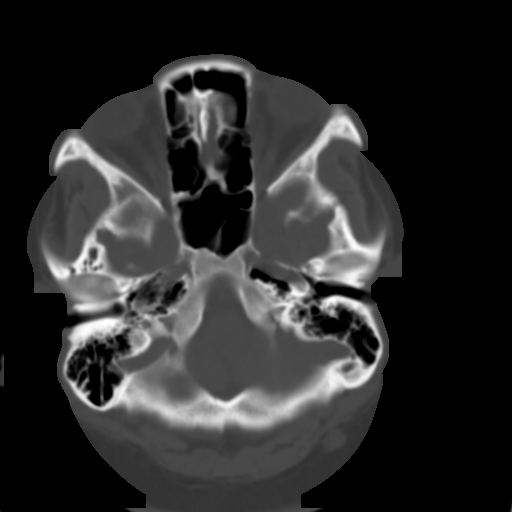
[im 8/31  brain]
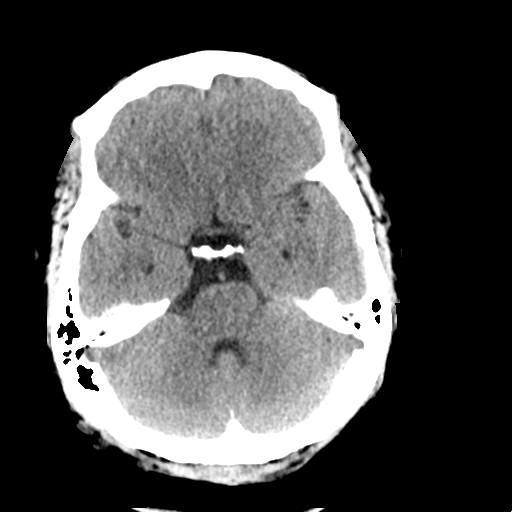
[im 12/31  brain]
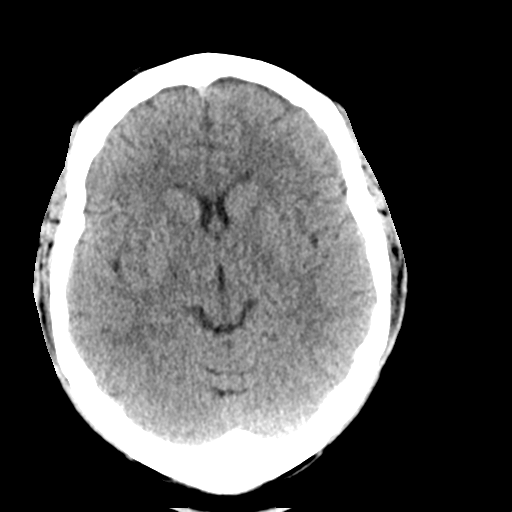
[im 16/31  brain]
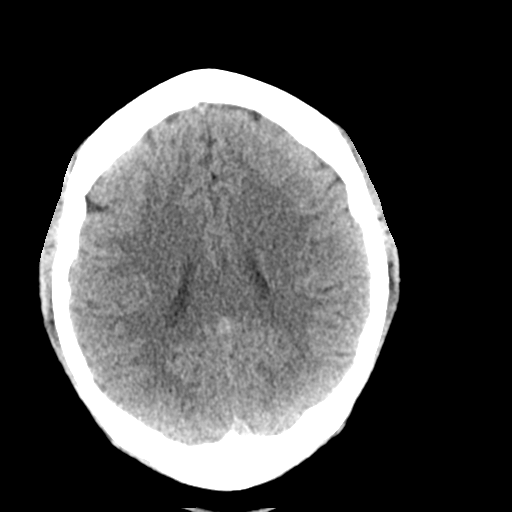
[im 19/31  brain]
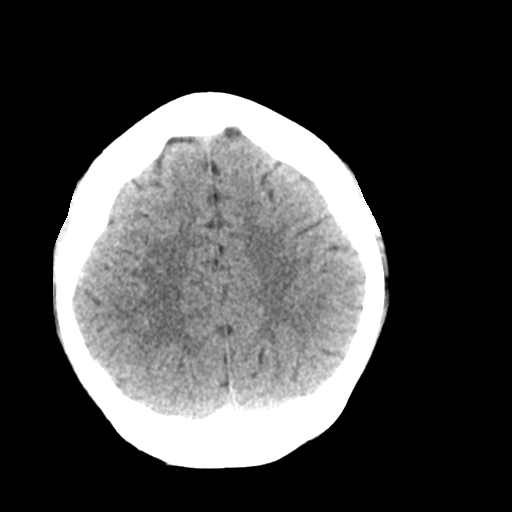
[im 19/31  bone]
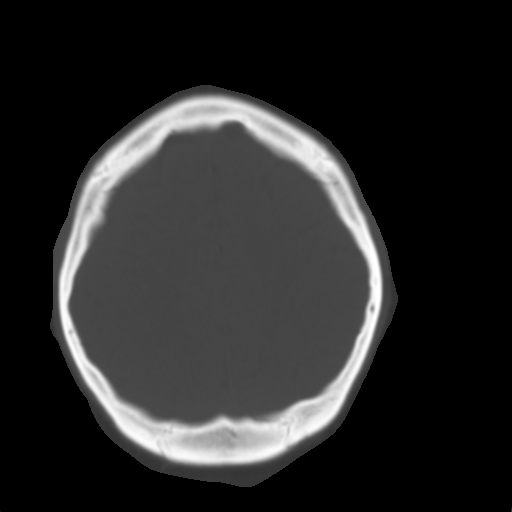
[im 23/31  brain]
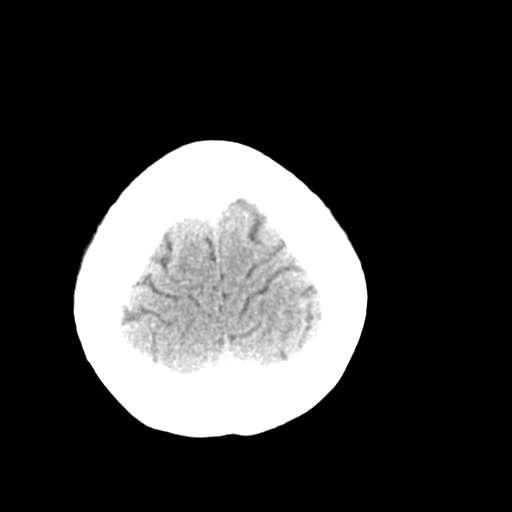
[im 27/31  brain]
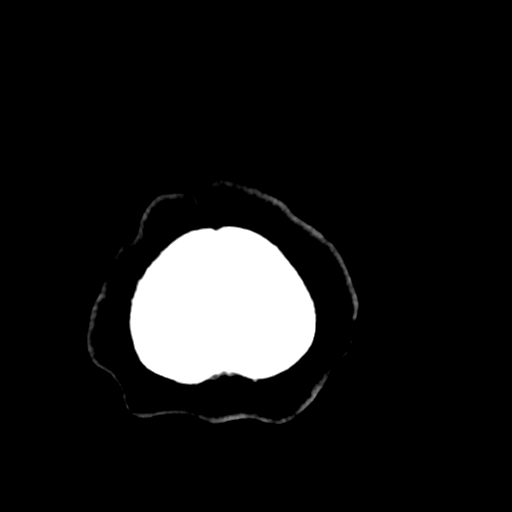

[Series 4: head bone · axial · 0.42mm/px · z∈[-165,-135]mm · 3 of 77 slices shown]
[im 8/77  bone]
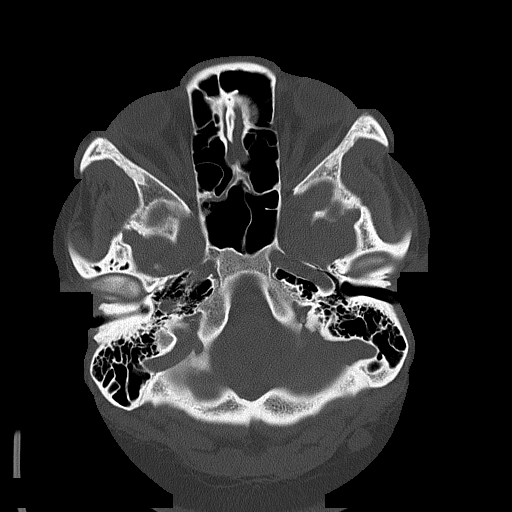
[im 16/77  bone]
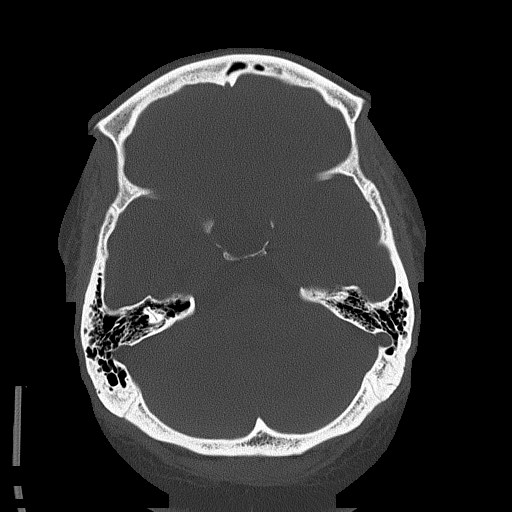
[im 23/77  bone]
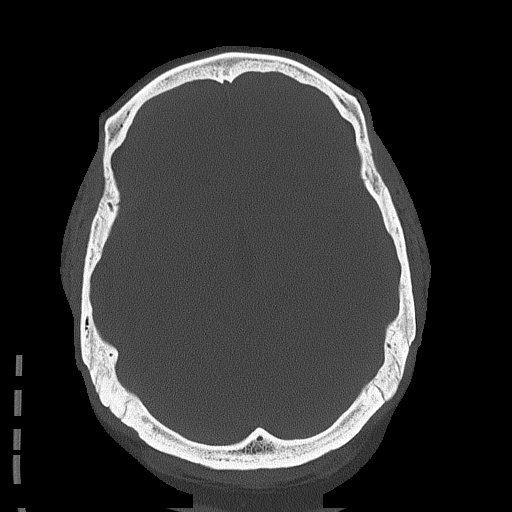

[Series 5: head without cor · coronal · non-contrast · 0.30mm/px · 3 of 77 slices shown]
[im 26/77  brain]
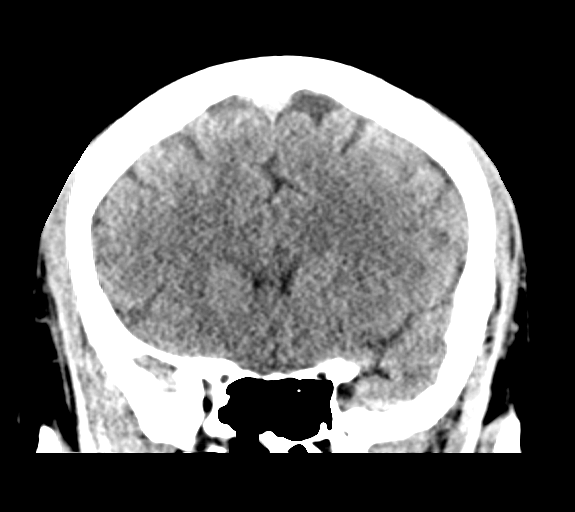
[im 34/77  brain]
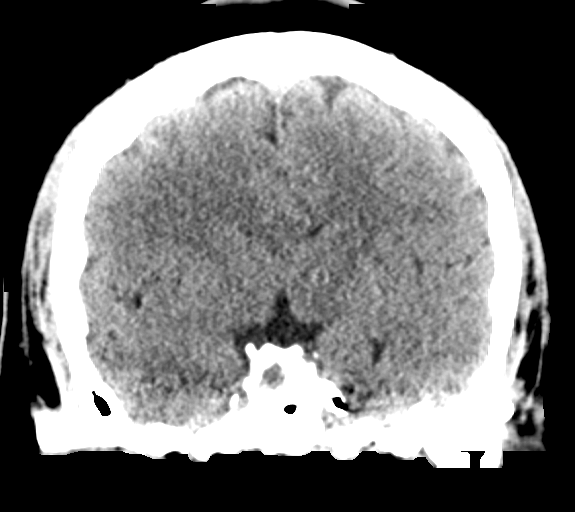
[im 43/77  brain]
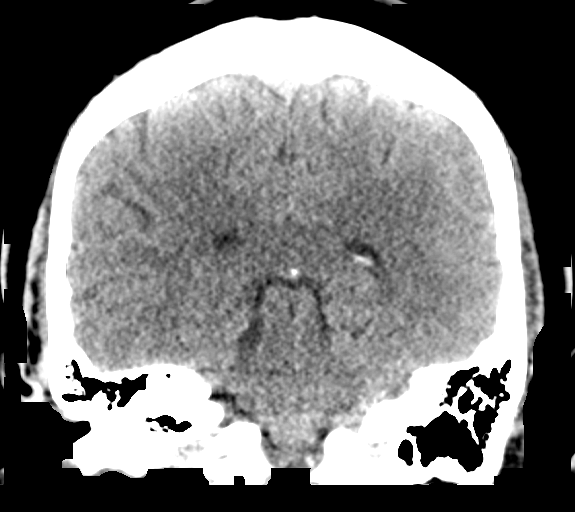

[Series 6: head without sag · sagittal · non-contrast · 0.29mm/px · 3 of 67 slices shown]
[im 23/67  brain]
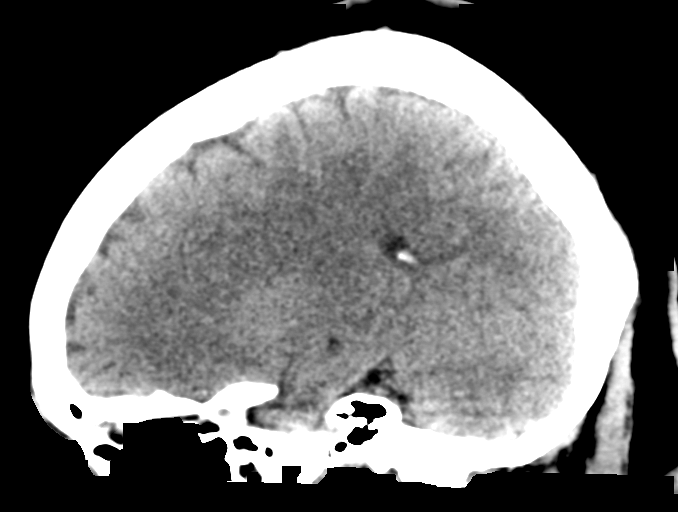
[im 34/67  brain]
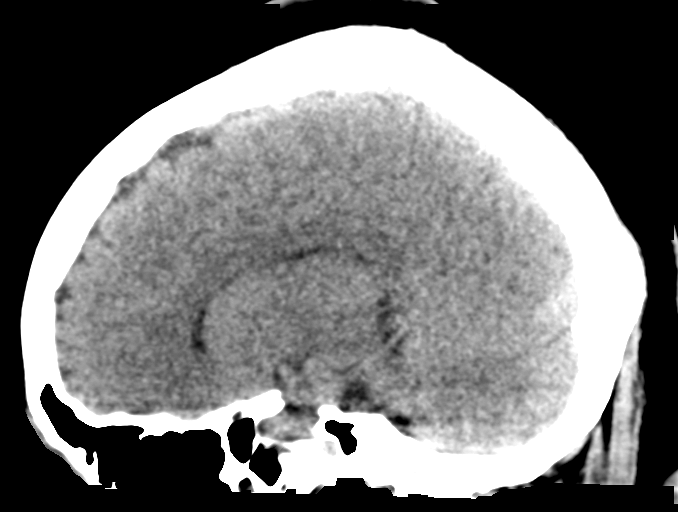
[im 45/67  brain]
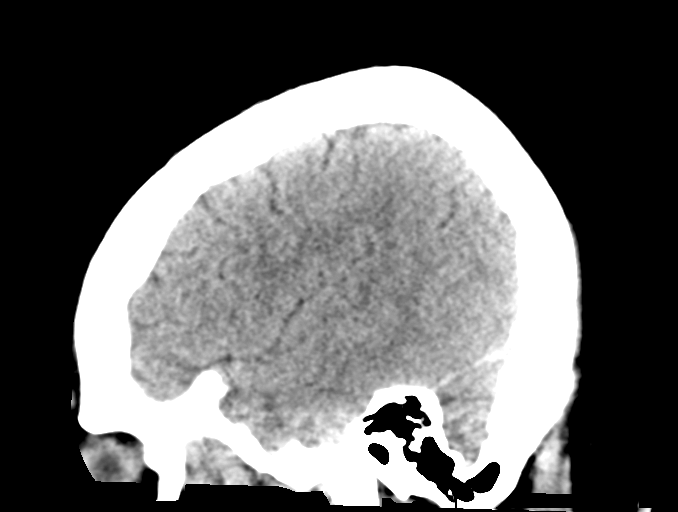

[16 of 47 positions shown; findings below may reference images not displayed]

FINDINGS: Brain: There is no evidence of acute infarct, intracranial
hemorrhage, mass, midline shift, or extra-axial fluid collection.
The ventricles and sulci are normal. There is an expanded, partially
empty sella.

Vascular: No hyperdense vessel.

Skull: No fracture or focal osseous lesion.

Sinuses/Orbits: Minimal mucosal thickening in the ethmoid air cells
bilaterally. Minimal frothy material in the right sphenoid sinus.
Clear mastoid air cells. Visualized orbits are unremarkable.

Other: None.
IMPRESSION: 1. No evidence of acute intracranial abnormality.
2. Partially empty sella.

## 2020-09-22 ENCOUNTER — Other Ambulatory Visit (INDEPENDENT_AMBULATORY_CARE_PROVIDER_SITE_OTHER): Payer: Self-pay | Admitting: Primary Care

## 2020-09-22 NOTE — Telephone Encounter (Signed)
Requested Prescriptions  Pending Prescriptions Disp Refills   fluticasone (FLONASE) 50 MCG/ACT nasal spray [Pharmacy Med Name: FLUTICASONE PROP 50 MCG SPRAY] 16 mL 0    Sig: SPRAY TWO SPRAYS IN EACH NOSTRIL ONCE DAILY     Ear, Nose, and Throat: Nasal Preparations - Corticosteroids Passed - 09/22/2020  4:39 PM      Passed - Valid encounter within last 12 months    Recent Outpatient Visits          11 months ago Vaginal discharge   [facility] Grayce Sessions, NP   1 year ago Chronic folliculitis   [facility] [place], Kinnie Scales, NP   1 year ago Eczema, unspecified type   [facility] Grayce Sessions, NP   2 years ago Rash and nonspecific skin eruption   [facility] Loletta Specter, PA-C

## 2020-11-29 ENCOUNTER — Other Ambulatory Visit (INDEPENDENT_AMBULATORY_CARE_PROVIDER_SITE_OTHER): Payer: Self-pay | Admitting: Primary Care

## 2020-11-29 NOTE — Telephone Encounter (Signed)
Courtesy refill. Attempted to contact patient to schedule future visit. # listed [phone] is not patient's number. Called and a different person answered and confirmed #.

## 2020-12-26 ENCOUNTER — Ambulatory Visit (INDEPENDENT_AMBULATORY_CARE_PROVIDER_SITE_OTHER): Payer: Commercial Managed Care - PPO | Admitting: Primary Care

## 2020-12-26 ENCOUNTER — Other Ambulatory Visit: Payer: Self-pay

## 2020-12-26 ENCOUNTER — Encounter (INDEPENDENT_AMBULATORY_CARE_PROVIDER_SITE_OTHER): Payer: Self-pay | Admitting: Primary Care

## 2020-12-26 VITALS — BP 114/75 | HR 74 | Temp 97.3°F | Ht 73.0 in | Wt 352.6 lb

## 2020-12-26 DIAGNOSIS — R21 Rash and other nonspecific skin eruption: Secondary | ICD-10-CM

## 2020-12-26 MED ORDER — TRIAMCINOLONE ACETONIDE 0.1 % EX CREA: 1.0000 "application " | TOPICAL_CREAM | Freq: Two times a day (BID) | CUTANEOUS | 0 refills | Status: AC

## 2020-12-26 NOTE — Patient Instructions (Signed)
Contact dermatitis is hypersensitivity reaction to a substance causing cellular immunity response. This may cause papules, vesicles, bullae with surrounding erythema and pruritic .Conservative treatment luke warm baths, Aveeno oatmeal bath or emollients. Medications can be discussed at this visit- antihistamine or pruritic medications. Causes may be contributed to changes in soaps, deodorants, laundry detergent or environmental- grass, weeds and trees.

## 2020-12-26 NOTE — Progress Notes (Signed)
Established Patient Office Visit  Subjective:  Patient ID: Debra Santana, female    DOB: 09-27-1996  Age: 25 y.o. MRN: [id number]  CC:  Chief Complaint  Patient presents with  . Referral    To dermatology     HPI Ms. Migdalia Olejniczak is a 25 year old morbid obese female who presents for rash on her neck bilateral. Not present today but has pictures and she will down load to my chart. .No contributing factor of changes  in soaps, deodorants, laundry detergent or environmental- grass, weeds and trees. First felt it may be a reaction to milk but change to almond milk with no improvement.    Past Medical History:  Diagnosis Date  . Asthma     Past Surgical History:  Procedure Laterality Date  . TONSILLECTOMY      No family history on file.  Social History   Socioeconomic History  . Marital status: Single    Spouse name: Not on file  . Number of children: Not on file  . Years of education: Not on file  . Highest education level: Not on file  Occupational History  . Not on file  Tobacco Use  . Smoking status: Never Smoker  . Smokeless tobacco: Never Used  Vaping Use  . Vaping Use: Never used  Substance and Sexual Activity  . Alcohol use: Not Currently  . Drug use: Not Currently  . Sexual activity: Not Currently  Other Topics Concern  . Not on file  Social History Narrative  . Not on file   Social Determinants of Health   Financial Resource Strain: Not on file  Food Insecurity: Not on file  Transportation Needs: Not on file  Physical Activity: Not on file  Stress: Not on file  Social Connections: Not on file  Intimate Partner Violence: Not on file    Outpatient Medications Prior to Visit  Medication Sig Dispense Refill  . fluticasone (FLONASE) 50 MCG/ACT nasal spray SPRAY TWO SPRAYS IN EACH NOSTRIL ONCE DAILY 16 mL 0   No facility-administered medications prior to visit.    No Known Allergies  ROS Pertinent positive and negative noted in HPI     Objective:    Physical Exam Vitals:   12/26/20 1612  BP: 114/75  Pulse: 74  Temp: (!) 97.3 F (36.3 C)  TempSrc: Temporal  SpO2: 94%  Weight: (!) 352 lb 9.6 oz (159.9 kg)  Height: 6\' 1"  (1.854 m)   General: Vital signs reviewed.  Patient is well-developed and well-nourished, morbid obese female in no acute distress and cooperative with exam.  Head: Normocephalic and atraumatic. Eyes: EOMI, conjunctivae normal Neck: Supple, trachea midline, normal ROM,  Cardiovascular: RRR, S1 normal, S2 normal, no murmurs, gallops, or rubs. Pulmonary/Chest: Clear to auscultation bilaterally, no wheezes, rales, or rhonchi. Abdominal: Soft, non-tender, non-distended, BS +, no masses, organomegaly, or guarding present.  Musculoskeletal: No joint deformities, erythema, or stiffness, ROM full and nontender. Extremities: No lower extremity edema bilaterally,  pulses symmetric and intact bilaterally. No cyanosis or clubbing. Neurological: A&O x3, Strength is normal and symmetric bilaterally, cranial nerve II-XII are grossly intact, no focal motor deficit, sensory intact to light touch bilaterally.  Skin: Warm, dry and intact. Discoloration around neck , cyst on the nap of head 2 red and painful scabbing over  Psychiatric: Normal mood and affect. speech and behavior is normal. Cognition and memory are normal.  BP 114/75 (BP Location: Right Arm, Patient Position: Sitting, Cuff Size: Large)   Pulse 74  Temp (!) 97.3 F (36.3 C) (Temporal)   Ht 6\' 1"  (1.854 m)   Wt (!) 352 lb 9.6 oz (159.9 kg)   LMP 12/07/2020 (Approximate)   SpO2 94%   BMI 46.52 kg/m  Wt Readings from Last 3 Encounters:  12/26/20 (!) 352 lb 9.6 oz (159.9 kg)  10/06/19 (!) 378 lb 3.2 oz (171.6 kg)  05/05/19 (!) 362 lb (164.2 kg)     Health Maintenance Due  Topic Date Due  . Hepatitis C Screening  Never done  . COVID-19 Vaccine (1) Never done  . HIV Screening  Never done    There are no preventive care reminders to display  for this patient.  Lab Results  Component Value Date   TSH 2.470 03/26/2018   Lab Results  Component Value Date   WBC 4.5 03/14/2019   HGB 11.2 (L) 03/14/2019   HCT 37.5 03/14/2019   MCV 82.2 03/14/2019   PLT 358 03/14/2019   Lab Results  Component Value Date   NA 138 03/14/2019   K 3.7 03/14/2019   CO2 22 03/14/2019   GLUCOSE 92 03/14/2019   BUN 6 03/14/2019   CREATININE 0.76 03/14/2019   BILITOT 0.4 03/14/2019   ALKPHOS 70 03/14/2019   AST 17 03/14/2019   ALT 14 03/14/2019   PROT 7.4 03/14/2019   ALBUMIN 3.6 03/14/2019   CALCIUM 9.0 03/14/2019   ANIONGAP 9 03/14/2019   No results found for: CHOL No results found for: HDL No results found for: LDLCALC No results found for: TRIG No results found for: CHOLHDL No results found for: 05/14/2019    Assessment & Plan:  Ailed was seen today for referral.  Diagnoses and all orders for this visit:  Rash and nonspecific skin eruption -     triamcinolone (KENALOG) 0.1 %; Apply 1 application topically 2 (two) times daily. -     Ambulatory referral to Dermatology   No orders of the defined types were placed in this encounter.   Follow-up: No follow-ups on file.    [id number], NP

## 2021-01-13 ENCOUNTER — Telehealth: Payer: Self-pay | Admitting: Primary Care

## 2021-01-13 NOTE — Telephone Encounter (Signed)
Called patient to tell her that her Dermatology referral was sent to [facility]. Patient states that she called [facility] and they do not accept [facility])    Copied from CRM [id number]. Topic: General - Other >> Jan 13, 2021 11:33 AM Jaquita Rector A wrote: Reason for CRM: Patient called in to check status of Dermatology referral and need an update please call at  Ph# [phone]

## 2021-01-16 NOTE — Telephone Encounter (Signed)
I have called the patient and was unsuccessful. I left a voicemail advising the patient to contact her insurance [employer] to see what dermatology clinics were in network within their coverage. I advised patient to give [place] a call back with the dermatology clinic that accepted her insurance in order to send the referral out.

## 2021-02-24 ENCOUNTER — Other Ambulatory Visit: Payer: Self-pay | Admitting: Primary Care

## 2021-04-28 ENCOUNTER — Telehealth (INDEPENDENT_AMBULATORY_CARE_PROVIDER_SITE_OTHER): Payer: Self-pay

## 2021-04-28 ENCOUNTER — Other Ambulatory Visit: Payer: Self-pay | Admitting: Primary Care

## 2021-04-28 NOTE — Telephone Encounter (Signed)
Pt is calling to check on the referral request [facility] on [address]. Please advise [phone]

## 2021-04-28 NOTE — Telephone Encounter (Signed)
Copied from CRM [id number]. Topic: Referral - Status >> Apr 26, 2021  4:44 PM Marylen Ponto wrote: Reason for CRM: Pt requests that the referral be sent [facility] [place], [place] phone# [phone]

## 2021-04-29 NOTE — Telephone Encounter (Signed)
Requested Prescriptions  Pending Prescriptions Disp Refills  . fluticasone (FLONASE) 50 MCG/ACT nasal spray [Pharmacy Med Name: FLUTICASONE PROP 50 MCG SPRAY] 16 mL 0    Sig: INSTILL 2 SPRAYS INTO EACH NOSTRIL ONCE DAILY     Ear, Nose, and Throat: Nasal Preparations - Corticosteroids Passed - 04/28/2021  5:47 PM      Passed - Valid encounter within last 12 months    Recent Outpatient Visits          4 months ago Rash and nonspecific skin eruption   [facility] Grayce Sessions, NP   1 year ago Vaginal discharge   [facility] Grayce Sessions, NP   1 year ago Chronic folliculitis   [facility] [place], Kinnie Scales, NP   2 years ago Eczema, unspecified type   [facility] Grayce Sessions, NP   2 years ago Rash and nonspecific skin eruption   [facility] Loletta Specter, PA-C

## 2021-08-19 ENCOUNTER — Other Ambulatory Visit: Payer: Self-pay | Admitting: Primary Care

## 2021-08-19 NOTE — Telephone Encounter (Signed)
Requested Prescriptions  Pending Prescriptions Disp Refills  . fluticasone (FLONASE) 50 MCG/ACT nasal spray [Pharmacy Med Name: FLUTICASONE PROP 50 MCG SPRAY] 16 mL 0    Sig: SPRAY TWO SPRAYS IN EACH NOSTRIL ONCE DAILY     Ear, Nose, and Throat: Nasal Preparations - Corticosteroids Passed - 08/19/2021  1:57 PM      Passed - Valid encounter within last 12 months    Recent Outpatient Visits          7 months ago Rash and nonspecific skin eruption   [facility] Grayce Sessions, NP   1 year ago Vaginal discharge   [facility] Grayce Sessions, NP   2 years ago Chronic folliculitis   [facility] [place], Kinnie Scales, NP   2 years ago Eczema, unspecified type   [facility] Grayce Sessions, NP   3 years ago Rash and nonspecific skin eruption   [facility] Loletta Specter, PA-C

## 2021-12-07 ENCOUNTER — Telehealth (INDEPENDENT_AMBULATORY_CARE_PROVIDER_SITE_OTHER): Payer: Self-pay | Admitting: Primary Care

## 2021-12-07 ENCOUNTER — Other Ambulatory Visit (INDEPENDENT_AMBULATORY_CARE_PROVIDER_SITE_OTHER): Payer: Self-pay

## 2021-12-07 ENCOUNTER — Other Ambulatory Visit: Payer: Self-pay | Admitting: Primary Care

## 2021-12-07 NOTE — Telephone Encounter (Signed)
Refilled

## 2021-12-07 NOTE — Telephone Encounter (Signed)
Pt is calling because she is interested in [facility] adding refills on the fluticasone. Pt states that she uses the medication frequently. Please advise CB- [phone]

## 2022-04-02 ENCOUNTER — Encounter: Payer: Self-pay | Admitting: Plastic Surgery

## 2022-04-02 ENCOUNTER — Ambulatory Visit (INDEPENDENT_AMBULATORY_CARE_PROVIDER_SITE_OTHER): Payer: Commercial Managed Care - PPO | Admitting: Plastic Surgery

## 2022-04-02 DIAGNOSIS — L910 Hypertrophic scar: Secondary | ICD-10-CM | POA: Insufficient documentation

## 2022-04-02 NOTE — Progress Notes (Signed)
     Patient ID: Debra Santana, female    DOB: July 21, 1996, 26 y.o.   MRN: [id number]   Chief Complaint  Patient presents with   Skin Problem    The patient is a 26 year old female here for evaluation of a skin lesion.  The patient states she has had it for a couple of years.  It right angle of her mandible.  It is approximately 2 cm in size and firm.  It feels like a hypertrophic or keloid scar.  She had it biopsied by her dermatologist.  The pathology showed scar tissue.  She has had keloids in the past and had them injected with steroids.  She is otherwise healthy.  She has had a tonsillectomy and has asthma.  She has not tried other conservative treatment methods today.   Review of Systems  Constitutional: Negative.   Eyes: Negative.   Respiratory: Negative.  Negative for chest tightness and shortness of breath.   Cardiovascular: Negative.   Gastrointestinal: Negative.   Endocrine: Negative.   Genitourinary: Negative.   Musculoskeletal: Negative.   Skin:  Positive for color change.   Past Medical History:  Diagnosis Date   Asthma     Past Surgical History:  Procedure Laterality Date   TONSILLECTOMY        Current Outpatient Medications:    fluticasone (FLONASE) 50 MCG/ACT nasal spray, SPRAY TWO SPRAYS IN EACH NOSTRIL ONCE DAILY, Disp: 16 mL, Rfl: 0   triamcinolone (KENALOG) 0.1 %, Apply 1 application topically 2 (two) times daily., Disp: 30 g, Rfl: 0   Objective:   Vitals:   04/02/22 1007  BP: 125/83  Pulse: 96  SpO2: 94%    Physical Exam Constitutional:      Appearance: Normal appearance.  Cardiovascular:     Rate and Rhythm: Normal rate.     Pulses: Normal pulses.  Pulmonary:     Effort: Pulmonary effort is normal.  Skin:    General: Skin is warm.     Capillary Refill: Capillary refill takes less than 2 seconds.     Coloration: Skin is not jaundiced.     Findings: Lesion present. No bruising.  Neurological:     Mental Status: She is alert and oriented  to person, place, and time.     Coordination: Coordination normal.  Psychiatric:        Mood and Affect: Mood normal.        Behavior: Behavior normal.        Thought Content: Thought content normal.        Judgment: Judgment normal.    Assessment & Plan:  Hypertrophic scar  Recommend massage, silicone sheets, pressure and Kenalog injection to see if we can keep this from getting any larger.  The patient is going to think about it.  If she wants to try the Kenalog we could do the first injection for $50 to see if it helps at all.  Pictures were obtained of the patient and placed in the chart with the patient's or guardian's permission.   Alena Bills Austan Nicholl, DO

## 2022-04-13 ENCOUNTER — Other Ambulatory Visit (INDEPENDENT_AMBULATORY_CARE_PROVIDER_SITE_OTHER): Payer: Self-pay | Admitting: Primary Care

## 2022-08-31 ENCOUNTER — Other Ambulatory Visit (INDEPENDENT_AMBULATORY_CARE_PROVIDER_SITE_OTHER): Payer: Self-pay | Admitting: Primary Care

## 2023-01-11 DIAGNOSIS — Z419 Encounter for procedure for purposes other than remedying health state, unspecified: Secondary | ICD-10-CM | POA: Diagnosis not present

## 2023-01-25 ENCOUNTER — Telehealth: Payer: Self-pay | Admitting: Emergency Medicine

## 2023-01-25 NOTE — Telephone Encounter (Signed)
Copied from [place] [phone]. Topic: Referral - Request for Referral >> Jan 25, 2023  2:40 PM Oley Balm A wrote: Has patient seen PCP for this complaint? No. *If NO, is insurance requiring patient see PCP for this issue before PCP can refer them? Referral for which specialty: Dermatology/Plastic Surgery Preferred provider/office: [facility] [address], [place], [place] [address]  [phone] Reason for referral: Pt is needing referral for Dermatology and was told it would be better for her to go to plastic surgery.

## 2023-01-29 ENCOUNTER — Other Ambulatory Visit (INDEPENDENT_AMBULATORY_CARE_PROVIDER_SITE_OTHER): Payer: Self-pay | Admitting: Primary Care

## 2023-01-29 ENCOUNTER — Ambulatory Visit (INDEPENDENT_AMBULATORY_CARE_PROVIDER_SITE_OTHER): Payer: Medicaid Other | Admitting: Primary Care

## 2023-01-29 ENCOUNTER — Encounter (INDEPENDENT_AMBULATORY_CARE_PROVIDER_SITE_OTHER): Payer: Self-pay | Admitting: Primary Care

## 2023-01-29 VITALS — BP 122/85 | HR 92 | Resp 16 | Ht 71.0 in | Wt 352.4 lb

## 2023-01-29 DIAGNOSIS — Z114 Encounter for screening for human immunodeficiency virus [HIV]: Secondary | ICD-10-CM | POA: Diagnosis not present

## 2023-01-29 DIAGNOSIS — F419 Anxiety disorder, unspecified: Secondary | ICD-10-CM

## 2023-01-29 DIAGNOSIS — N921 Excessive and frequent menstruation with irregular cycle: Secondary | ICD-10-CM | POA: Diagnosis not present

## 2023-01-29 DIAGNOSIS — R7989 Other specified abnormal findings of blood chemistry: Secondary | ICD-10-CM

## 2023-01-29 DIAGNOSIS — Z1159 Encounter for screening for other viral diseases: Secondary | ICD-10-CM | POA: Diagnosis not present

## 2023-01-29 DIAGNOSIS — Z6841 Body Mass Index (BMI) 40.0 and over, adult: Secondary | ICD-10-CM | POA: Diagnosis not present

## 2023-01-29 DIAGNOSIS — Z131 Encounter for screening for diabetes mellitus: Secondary | ICD-10-CM

## 2023-01-29 DIAGNOSIS — F32A Depression, unspecified: Secondary | ICD-10-CM

## 2023-01-29 DIAGNOSIS — L910 Hypertrophic scar: Secondary | ICD-10-CM | POA: Diagnosis not present

## 2023-01-29 DIAGNOSIS — E6609 Other obesity due to excess calories: Secondary | ICD-10-CM | POA: Diagnosis not present

## 2023-01-29 DIAGNOSIS — R5383 Other fatigue: Secondary | ICD-10-CM | POA: Diagnosis not present

## 2023-01-29 DIAGNOSIS — Z7689 Persons encountering health services in other specified circumstances: Secondary | ICD-10-CM

## 2023-01-29 NOTE — Progress Notes (Signed)
New Patient Office Visit  Subjective    Patient ID: Debra Santana, female    DOB: October 27, 1996  Age: 27 y.o. MRN: [id number]  CC:  Chief Complaint  Patient presents with   New Patient (Initial Visit)    Re-establish care    Referral    Plastic surgery     HPI Debra Santana is a morbid obese female who presents to establish care.  Her major concerns she voices is her weight and how it makes her feel about herself, depressed, low self-esteem requesting to weight loss medication and referral to plastic surgery for a hypertrophic scar.  She will contact insurance to determine what options we have for weight loss management.  Patient has No headache, No chest pain, No abdominal pain - No Nausea, No new weakness tingling or numbness, No Cough - shortness of breath    Outpatient Encounter Medications as of 01/29/2023  Medication Sig   fluticasone (FLONASE) 50 MCG/ACT nasal spray SPRAY TWO SPRAYS IN EACH NOSTRIL ONCE DAILY   triamcinolone (KENALOG) 0.1 % Apply 1 application topically 2 (two) times daily. (Patient not taking: Reported on 01/29/2023)   No facility-administered encounter medications on file as of 01/29/2023.    Past Medical History:  Diagnosis Date   Asthma     Past Surgical History:  Procedure Laterality Date   TONSILLECTOMY      No family history on file.  Social History   Socioeconomic History   Marital status: Single    Spouse name: Not on file   Number of children: Not on file   Years of education: Not on file   Highest education level: Not on file  Occupational History   Not on file  Tobacco Use   Smoking status: Never   Smokeless tobacco: Never  Vaping Use   Vaping Use: Never used  Substance and Sexual Activity   Alcohol use: Not Currently   Drug use: Not Currently   Sexual activity: Not Currently  Other Topics Concern   Not on file  Social History Narrative   Not on file   Social Determinants of Health   Financial Resource Strain: Not on  file  Food Insecurity: Not on file  Transportation Needs: Not on file  Physical Activity: Not on file  Stress: Not on file  Social Connections: Not on file  Intimate Partner Violence: Not on file    ROS Comprehensive ROS Pertinent positive and negative noted in HPI       Objective    Blood Pressure 122/85   Pulse 92   Respiration 16   Height 5\' 11"  (1.803 m)   Weight (Abnormal) 352 lb 6.4 oz (159.8 kg)   Oxygen Saturation 95%   Body Mass Index 49.15 kg/m   Physical Exam  Physical exam: General: Vital signs reviewed.  Patient is well-developed and well-nourished, severe morbid obese female in no acute distress and cooperative with exam. Head: Normocephalic and atraumatic. Eyes: EOMI, conjunctivae normal, no scleral icterus. Neck: Supple, trachea midline, normal ROM, no JVD, masses, thyromegaly, or carotid bruit present. Cardiovascular: RRR, S1 normal, S2 normal, no murmurs, gallops, or rubs. Pulmonary/Chest: Clear to auscultation bilaterally, no wheezes, rales, or rhonchi. Abdominal: Soft, non-tender, non-distended, BS +, no masses, organomegaly, or guarding present. Musculoskeletal: No joint deformities, erythema, or stiffness, ROM full and nontender. Extremities: No lower extremity edema bilaterally,  pulses symmetric and intact bilaterally. No cyanosis or clubbing. Neurological: A&O x3, Strength is normal Skin: Warm, dry and intact. No rashes or erythema.  Psychiatric: Normal mood and affect. speech and behavior is normal. Cognition and memory are normal.       Assessment & Plan:  Debra Santana was seen today for new patient (initial visit) and referral.  Diagnoses and all orders for this visit:  Encounter to establish care  Anxiety and depression Secondary to weight   Call hypertrophic scar  -     Ambulatory referral to Plastic Surgery  Menorrhagia with irregular cycle -     CBC with Differential -     CMP14+EGFR  Screening for diabetes mellitus (DM) -      Hemoglobin A1c  Elevated serum free T4 level -     TSH + free T4  Encounter for HCV screening test for low risk patient -     HCV Ab w Reflex to Quant PCR  Encounter for screening for HIV -     HIV Antibody (routine testing w rflx)  Other orders -     Interpretation:    Kerin Perna, NP

## 2023-01-30 LAB — CBC WITH DIFFERENTIAL/PLATELET
Basos: 1 %
Hematocrit: 37.1 % (ref 34.0–46.6)
MCH: 24.5 pg — ABNORMAL LOW (ref 26.6–33.0)

## 2023-01-30 LAB — HEMOGLOBIN A1C: Est. average glucose Bld gHb Est-mCnc: 117 mg/dL

## 2023-01-30 LAB — CMP14+EGFR
AST: 16 IU/L (ref 0–40)
BUN/Creatinine Ratio: 8 — ABNORMAL LOW (ref 9–23)

## 2023-01-31 LAB — CMP14+EGFR
ALT: 9 IU/L (ref 0–32)
Albumin/Globulin Ratio: 1.3 (ref 1.2–2.2)
Albumin: 4.5 g/dL (ref 4.0–5.0)
Alkaline Phosphatase: 98 IU/L (ref 44–121)
BUN: 6 mg/dL (ref 6–20)
Bilirubin Total: 0.3 mg/dL (ref 0.0–1.2)
CO2: 22 mmol/L (ref 20–29)
Calcium: 9.7 mg/dL (ref 8.7–10.2)
Chloride: 101 mmol/L (ref 96–106)
Creatinine, Ser: 0.73 mg/dL (ref 0.57–1.00)
Globulin, Total: 3.6 g/dL (ref 1.5–4.5)
Glucose: 86 mg/dL (ref 70–99)
Potassium: 5.1 mmol/L (ref 3.5–5.2)
Sodium: 139 mmol/L (ref 134–144)
Total Protein: 8.1 g/dL (ref 6.0–8.5)
eGFR: 116 mL/min/{1.73_m2} (ref >59)

## 2023-01-31 LAB — CBC WITH DIFFERENTIAL/PLATELET
Basophils Absolute: 0.1 10*3/uL (ref 0.0–0.2)
EOS (ABSOLUTE): 0.8 10*3/uL — ABNORMAL HIGH (ref 0.0–0.4)
Eos: 13 %
Hemoglobin: 11.5 g/dL (ref 11.1–15.9)
Immature Grans (Abs): 0 10*3/uL (ref 0.0–0.1)
Immature Granulocytes: 0 %
Lymphocytes Absolute: 2.3 10*3/uL (ref 0.7–3.1)
Lymphs: 36 %
MCHC: 31 g/dL — ABNORMAL LOW (ref 31.5–35.7)
MCV: 79 fL (ref 79–97)
Monocytes Absolute: 0.5 10*3/uL (ref 0.1–0.9)
Monocytes: 8 %
Neutrophils Absolute: 2.7 10*3/uL (ref 1.4–7.0)
Neutrophils: 42 %
Platelets: 428 10*3/uL (ref 150–450)
RBC: 4.69 x10E6/uL (ref 3.77–5.28)
RDW: 16 % — ABNORMAL HIGH (ref 11.7–15.4)
WBC: 6.4 10*3/uL (ref 3.4–10.8)

## 2023-01-31 LAB — TSH+FREE T4
Free T4: 1.08 ng/dL (ref 0.82–1.77)
TSH: 2.2 u[IU]/mL (ref 0.450–4.500)

## 2023-01-31 LAB — HCV INTERPRETATION

## 2023-01-31 LAB — HCV AB W REFLEX TO QUANT PCR: HCV Ab: NONREACTIVE

## 2023-01-31 LAB — HIV ANTIBODY (ROUTINE TESTING W REFLEX): HIV Screen 4th Generation wRfx: NONREACTIVE

## 2023-01-31 LAB — HEMOGLOBIN A1C: Hgb A1c MFr Bld: 5.7 % — ABNORMAL HIGH (ref 4.8–5.6)

## 2023-02-04 ENCOUNTER — Telehealth (INDEPENDENT_AMBULATORY_CARE_PROVIDER_SITE_OTHER): Payer: Self-pay

## 2023-02-04 NOTE — Telephone Encounter (Signed)
Contacted pt to go over lab results pt is aware and doesn't have any questions or concerns 

## 2023-02-11 ENCOUNTER — Ambulatory Visit (INDEPENDENT_AMBULATORY_CARE_PROVIDER_SITE_OTHER): Payer: Commercial Managed Care - PPO | Admitting: Primary Care

## 2023-02-11 DIAGNOSIS — Z0120 Encounter for dental examination and cleaning without abnormal findings: Secondary | ICD-10-CM | POA: Diagnosis not present

## 2023-02-11 DIAGNOSIS — Z419 Encounter for procedure for purposes other than remedying health state, unspecified: Secondary | ICD-10-CM | POA: Diagnosis not present

## 2023-02-20 ENCOUNTER — Encounter (INDEPENDENT_AMBULATORY_CARE_PROVIDER_SITE_OTHER): Payer: Self-pay | Admitting: Primary Care

## 2023-02-20 ENCOUNTER — Ambulatory Visit (INDEPENDENT_AMBULATORY_CARE_PROVIDER_SITE_OTHER): Payer: Medicaid Other | Admitting: Primary Care

## 2023-02-20 VITALS — BP 138/86 | HR 104 | Resp 16 | Ht 71.0 in | Wt 352.0 lb

## 2023-02-20 DIAGNOSIS — F32A Depression, unspecified: Secondary | ICD-10-CM

## 2023-02-20 DIAGNOSIS — F419 Anxiety disorder, unspecified: Secondary | ICD-10-CM

## 2023-02-20 NOTE — Progress Notes (Signed)
Elevated scores on PHQ-9 and GAD7

## 2023-02-21 ENCOUNTER — Telehealth (INDEPENDENT_AMBULATORY_CARE_PROVIDER_SITE_OTHER): Payer: Self-pay | Admitting: Licensed Clinical Social Worker

## 2023-02-21 NOTE — Progress Notes (Signed)
[facility]  [place], is a 27 y.o. female  WGN:[id number]  HQI:[id number]  DOB - 1996/01/27  Chief Complaint  Patient presents with   Depression       Subjective:   Debra Santana is a 27 y.o. female here today for a follow up visit for depression.  Asked if she was suicidal she stated that she feels that way a lot of time the only thing that is important in her life that she lives for is her mother.  Question if he was going to commit suicide how to do it she stated finite tallest building and jump off of it.  Then question what tallest building to have you been looking at she had not so this indicated a not well thought about trying but the thought continues.  Patient began to cry stated what is wrong what can I do to help you patient stated nobody can help make everything Xarelto nothing is going to ever get better at this point call clinical nurse manager which she will reach clinical licensed social worker and combined visits discussed treatment plan and follow-up with patient. Patient has No headache, No chest pain, No abdominal pain - No Nausea, No new weakness tingling or numbness, No Cough - shortness of breath.  After spending over an hour with patient she had to leave due to a interview.  Call patient the next day to make sure she was okay no answer  No problems updated.  No Known Allergies  Past Medical History:  Diagnosis Date   Asthma     Current Outpatient Medications on File Prior to Visit  Medication Sig Dispense Refill   fluticasone (FLONASE) 50 MCG/ACT nasal spray SPRAY TWO SPRAYS IN EACH NOSTRIL ONCE DAILY (Patient not taking: Reported on 02/20/2023) 16 mL 0   triamcinolone (KENALOG) 0.1 % Apply 1 application topically 2 (two) times daily. (Patient not taking: Reported on 02/20/2023) 30 g 0   No current facility-administered medications on file prior to visit.    Objective:   Vitals:   02/20/23 1633  BP: 138/86  Pulse: (Abnormal) 104   Resp: 16  SpO2: 95%  Weight: (Abnormal) 352 lb (159.7 kg)  Height:  (1.803 m)    Comprehensive ROS Pertinent positive and negative noted in HPI   Exam General appearance : Awake, alert, not in any distress. Speech Clear. Not toxic looking HEENT: Atraumatic and Normocephalic, pupils equally reactive to light and accomodation Neck: Supple, no JVD. No cervical lymphadenopathy.  Chest: Good air entry bilaterally, no added sounds  CVS: S1 S2 regular, no murmurs.  Abdomen: Bowel sounds present, Non tender and not distended with no gaurding, rigidity or rebound. Extremities: B/L Lower Ext shows no edema, both legs are warm to touch Neurology: Awake alert, and oriented X 3, , Non focal Skin: No Rash  Data Review Lab Results  Component Value Date   HGBA1C 5.7 (H) 01/29/2023    Assessment & Plan  Tonita was seen today for depression.  Diagnoses and all orders for this visit:  Anxiety and depression Flowsheet Row Office Visit from 02/20/2023 in [facility]  PHQ-9 Total Score 27     Interventions was combined with PCP and clinical social worker to follow-up with her for counseling and reevaluated for medication    Patient have been counseled extensively about nutrition and exercise. Other issues discussed during this visit include: low cholesterol diet, weight control and daily exercise, foot care, annual eye examinations at Ophthalmology,  importance of adherence with medications and regular follow-up. We also discussed long term complications of uncontrolled diabetes and hypertension.   No follow-ups on file.  The patient was given clear instructions to go to ER or return to medical center if symptoms don't improve, worsen or new problems develop. The patient verbalized understanding. The patient was told to call to get lab results if they haven't heard anything in the next week.   This note has been created with Stage manager. Any transcriptional errors are unintentional.   Grayce Sessions, NP 02/21/2023, 4:02 PM

## 2023-02-21 NOTE — Telephone Encounter (Signed)
Patient was referred to [facility] by PCP for elevated scores on the PHQ-9. Therapist met with the patient and with provider via TEAMS.  Patient shared no plans to harm herself but stated that she did not want to be here.  But has felt this way for years. LCSWA provided patient with resources.  Patient expressed that she was ready to leave because she had a job interview that was important to her at 5:30.  Pt agreed to contact PCP after her interview to share how it went and plans to see her in 2 weeks.

## 2023-02-27 ENCOUNTER — Ambulatory Visit: Payer: Medicaid Other | Admitting: Plastic Surgery

## 2023-02-27 ENCOUNTER — Encounter: Payer: Self-pay | Admitting: Plastic Surgery

## 2023-02-27 VITALS — BP 131/84 | HR 70 | Ht 71.0 in | Wt 355.2 lb

## 2023-02-27 DIAGNOSIS — L989 Disorder of the skin and subcutaneous tissue, unspecified: Secondary | ICD-10-CM | POA: Diagnosis not present

## 2023-02-27 DIAGNOSIS — L910 Hypertrophic scar: Secondary | ICD-10-CM | POA: Diagnosis not present

## 2023-02-27 NOTE — Progress Notes (Signed)
Referring Provider Grayce Sessions, NP [address] [place],  [place] [address]   CC:  Chief Complaint  Patient presents with   Advice Only      Debra Santana is an 27 y.o. female.  HPI: Debra Santana is a 27 year old female who presents today for evaluation of scars on her right mandible and her posterior neck.  Is unclear what the etiology of the right mandible scar is but the posterior neck is clearly a keloid from a previous excision.  She is requesting that I help her with management of these lesions.  No Known Allergies  Outpatient Encounter Medications as of 02/27/2023  Medication Sig   fluticasone (FLONASE) 50 MCG/ACT nasal spray SPRAY TWO SPRAYS IN EACH NOSTRIL ONCE DAILY (Patient not taking: Reported on 02/20/2023)   triamcinolone (KENALOG) 0.1 % Apply 1 application topically 2 (two) times daily. (Patient not taking: Reported on 02/20/2023)   No facility-administered encounter medications on file as of 02/27/2023.     Past Medical History:  Diagnosis Date   Asthma     Past Surgical History:  Procedure Laterality Date   TONSILLECTOMY      No family history on file.  Social History   Social History Narrative   Not on file     Review of Systems General: Denies fevers, chills, weight loss CV: Denies chest pain, shortness of breath, palpitations Skin: 2 cm incision on the right mandible and 3 cm linear keloid on the posterior neck  Physical Exam    02/27/2023   10:56 AM 02/20/2023    4:33 PM 01/29/2023   11:32 AM  Vitals with BMI  Height     Weight 355 lbs 3 oz 352 lbs 352 lbs 6 oz  BMI 49.56 49.12 49.17  Systolic 131 138 604  Diastolic 84 86 85  Pulse 70 104 92    General:  No acute distress,  Alert and oriented, Non-Toxic, Normal speech and affect Integument: As noted above lesions on the right mandible and posterior neck.  The right mandible is not clearly a keloid and may be cyst.  The posterior neck is clearly a keloid of the  previous incision site. Mammogram: Not applicable Assessment/Plan Skin lesions: I recommended to the patient that she have the right mandible lesion excised and since I am not sure if it is a keloid that we defer injection of the surgical site until the pathology returns.  If the pathology comes back as a keloid then I would inject it with Kenalog at the time of her suture removal. The posterior neck lesion is a keloid and I have recommended that she have steroid injection today.  If the keloid significantly improves we will do a second injection at the time of her mandibular skin lesion excision.  If it does not improve we will plan on excising it at the same time as the mandibular skin lesion excision.  She understands all of this and is asked me to schedule her for excision of the mandibular skin lesion in the office.  Procedure Note  Preoperative Dx: Posterior neck keloid  Postoperative Dx: Same  Procedure: Injection of keloid with a 50-50 mixture of Kenalog 40 and 1% plain lidocaine   Indication for Procedure: Treat burning and itching from keloid  Description of Procedure: Risks and complications were explained to the patient including no improvement and hypopigmentation of the lesion.  Consent was confirmed and the patient understands the risks and benefits.  The potential  complications and alternatives were explained and the patient consents.  The patient expressed understanding the option of not having the procedure and the risks of a scar.  Time out was called and all information was confirmed to be correct.    The keloid was cleaned with alcohol and injected with 0.8 mL of a 50-50 mixture of Kenalog 40 and 1% plain lidocaine.  There were no complications A dressing was applied.  The patient was given instructions on how to care for the area and a follow up appointment.  Debra Santana tolerated the procedure well and there were no complications.   Debra Santana 02/27/2023, 11:22 AM

## 2023-03-13 DIAGNOSIS — Z419 Encounter for procedure for purposes other than remedying health state, unspecified: Secondary | ICD-10-CM | POA: Diagnosis not present

## 2023-04-09 ENCOUNTER — Ambulatory Visit: Payer: Medicaid Other | Admitting: Plastic Surgery

## 2023-04-09 ENCOUNTER — Encounter: Payer: Self-pay | Admitting: Plastic Surgery

## 2023-04-09 VITALS — BP 112/74 | HR 80

## 2023-04-09 DIAGNOSIS — L910 Hypertrophic scar: Secondary | ICD-10-CM

## 2023-04-09 DIAGNOSIS — L989 Disorder of the skin and subcutaneous tissue, unspecified: Secondary | ICD-10-CM

## 2023-04-09 NOTE — Progress Notes (Signed)
Procedure Note  Preoperative Dx: Skin lesion right mandible, keloid right neck, keloid posterior neck  Postoperative Dx: Same  Procedure: Excision of skin lesion, injection of the right neck and posterior neck keloids  Anesthesia: Lidocaine 1% with 1:100,000 epinephrine and 0.25% Sensorcaine   Indication for Procedure: Treatment of multiple skin lesions  Description of Procedure: Risks and complications were explained to the patient including recurrence and need for additional procedures based on pathology.  Consent was confirmed and the patient understands the risks and benefits.  The potential complications and alternatives were explained and the patient consents.  The patient expressed understanding the option of not having the procedure and the risks of a scar.  Time out was called and all information was confirmed to be correct.    The area was prepped and drapped.  Local anesthetic was injected in the subcutaneous tissues.  After waiting for the local to take affect the right mandibular skin lesion was excised.  After obtaining hemostasis, the surgical wound was closed with interrupted 5-0 Prolene sutures.  The surgical wound measured 2 cm.  A dressing was applied.  Attention was then turned to the keloids.  The neck keloid was injected with a 50-50 mixture of Kenalog 40 and 1% plain lidocaine.  Approximately 0.5 mL of the solution was injected into the keloid.  The posterior neck keloid was then injected with the same solution.  Approximately 1 mL of the Kenalog 40 and 1% lidocaine were injected into the keloid.  The patient was given instructions on how to care for the area and a follow up appointment.  Courtnie tolerated the procedure well and there were no complications. The specimen was sent to pathology.

## 2023-04-13 DIAGNOSIS — Z419 Encounter for procedure for purposes other than remedying health state, unspecified: Secondary | ICD-10-CM | POA: Diagnosis not present

## 2023-04-17 ENCOUNTER — Encounter: Payer: Self-pay | Admitting: Student

## 2023-04-17 ENCOUNTER — Ambulatory Visit (INDEPENDENT_AMBULATORY_CARE_PROVIDER_SITE_OTHER): Payer: Medicaid Other | Admitting: Student

## 2023-04-17 VITALS — BP 112/74 | HR 81

## 2023-04-17 DIAGNOSIS — L910 Hypertrophic scar: Secondary | ICD-10-CM

## 2023-04-17 DIAGNOSIS — L989 Disorder of the skin and subcutaneous tissue, unspecified: Secondary | ICD-10-CM

## 2023-04-17 NOTE — Progress Notes (Signed)
Patient is a 27 year old female who underwent excision of skin lesion to right mandible, and injection of right neck and posterior neck keloids with Dr. Ladona Ridgel on 04/09/2023.  During the procedure, the mandibular skin lesion was excised.  The surgical wound was closed with 5-0 Prolene sutures.  The neck keloid and posterior neck keloids were injected with a mixture of Kenalog 40 and 1% lidocaine.  The right mandibular lesion was sent to pathology and found to be a keloid.  Patient presents to the clinic today for postprocedural follow-up.  Today, patient reports she is doing well.  She states that she feels her keloids have been improving since her injection.  She denies any issues with the procedure site.  She denies any fevers or chills.  I discussed the results of the pathology with the patient, that the lesion was consistent with keloid.  She states that she and Dr. Ladona Ridgel discussed doing a Kenalog injection today to the area if it did come back as a keloid.  Patient states that she is interested in moving forward with the injection today.  On exam, patient is sitting upright in no acute distress.  Incision site is intact with Prolene sutures.  There is no surrounding erythema or drainage on exam.  There is a little bit of firmness noted to the medial aspect of the incision that appears to be consistent with residual keloid.  Keloids are still present to the right neck and the posterior neck.  Prolene sutures were removed without difficulty.  Patient tolerated well.  Kenalog 40 and 1% lidocaine were mixed.  Medial aspect of the right mandibular incision was prepped with alcohol.  Residual keloid was injected with 0.2 cc of the mixture.  Patient tolerated well.  I recommend the patient continue with keloid massage to her right mandible, neck and posterior neck.  I also recommended she utilize silicone scar creams or tapes to as well.  Patient expressed understanding.  Patient to follow back up in 4  weeks.  I instructed the patient to call back in the meantime she has any questions or concerns about anything.

## 2023-04-23 ENCOUNTER — Ambulatory Visit (INDEPENDENT_AMBULATORY_CARE_PROVIDER_SITE_OTHER): Payer: Medicaid Other | Admitting: Dermatology

## 2023-04-23 ENCOUNTER — Ambulatory Visit: Payer: Medicaid Other | Admitting: Dermatology

## 2023-04-23 ENCOUNTER — Telehealth: Payer: Self-pay | Admitting: Student

## 2023-04-23 ENCOUNTER — Encounter: Payer: Self-pay | Admitting: Dermatology

## 2023-04-23 VITALS — BP 124/82

## 2023-04-23 DIAGNOSIS — L700 Acne vulgaris: Secondary | ICD-10-CM

## 2023-04-23 DIAGNOSIS — L910 Hypertrophic scar: Secondary | ICD-10-CM | POA: Diagnosis not present

## 2023-04-23 MED ORDER — CLINDAMYCIN PHOSPHATE 1 % EX SWAB: 1.0000 | Freq: Every day | CUTANEOUS | 2 refills | Status: DC

## 2023-04-23 MED ORDER — RETIN-A 0.05 % EX CREA: TOPICAL_CREAM | Freq: Every day | CUTANEOUS | 2 refills | Status: AC

## 2023-04-23 NOTE — Telephone Encounter (Signed)
Patient called to follow up from appointment with Caroline More, She asked about the scar cream and tape and I got the prices from [place] and patient said they were to expensive and she wanted to know if there was anything over the counter that would be cheaper from a store. Callback is [phone]

## 2023-04-23 NOTE — Patient Instructions (Addendum)
Daily Skincare Regimen: Patient Handout  Morning Routine:  Cleanse: Start your day by washing your face with a gentle cleanser. Choose a product suitable for your skin type, such as CeraVe, Neutrogena, Cetaphil, or LaRoche-Posay. Gently massage the cleanser onto damp skin, then rinse thoroughly with lukewarm water and pat dry with a clean towel.  Apply Medication: Apply prescription medication Clindamycin swabs every morning   Moisturize: Finish your morning routine by applying a moisturizer to your face and neck. Opt for a moisturizer that has SPF included, is suitable for your skin type and provides hydration without clogging pores. Consider brands like CeraVe, Neutrogena, Cetaphil, or LaRoche-Posay for effective hydration and skin barrier support.  Evening Routine:  Cleanse: Before bed, cleanse your face again with a gentle cleanser to remove makeup, dirt, and impurities accumulated throughout the day. Use the same cleanser as in the morning and follow the same steps for cleansing.  Apply Medication: Ensure that your skin is completely dry before applying any topical treatments to maximize their efficacy.  Apply prescription medication Retin A 0.05% 2 nights per week. Use moisturizer before and after application.   Moisturize: After applying any medications, moisturize your skin to seal in hydration and support skin barrier function. Choose a moisturizer suitable for nighttime use that helps replenish moisture overnight. Look for products from trusted brands like CeraVe, Neutrogena, Cetaphil, or LaRoche-Posay for optimal results.   Additional Tips:  Sun Protection: During the daytime, it is essential to apply a broad-spectrum sunscreen with an SPF of 30 or higher to protect your skin from harmful UV rays. Apply sunscreen as the last step of your morning skincare routine and reapply throughout the day as needed, especially if you will be spending time outdoors.  Hydration:  Drink plenty of water throughout the day to keep your skin hydrated from within.  Healthy Lifestyle: Maintain a balanced diet, get regular exercise, manage stress, and prioritize adequate sleep to support overall skin health.   Following a consistent daily skincare regimen can help promote healthy, radiant skin and minimize the risk of common skin issues. Be patient and consistent with your routine, and remember to listen to your skin's needs Due to recent changes in healthcare laws, you may see results of your pathology and/or laboratory studies on MyChart before the doctors have had a chance to review them. We understand that in some cases there may be results that are confusing or concerning to you. Please understand that not all results are received at the same time and often the doctors may need to interpret multiple results in order to provide you with the best plan of care or course of treatment. Therefore, we ask that you please give [place] 2 business days to thoroughly review all your results before contacting the office for clarification. Should we see a critical lab result, you will be contacted sooner.   If You Need Anything After Your Visit  If you have any questions or concerns for your doctor, please call our main line at [phone] If no one answers, please leave a voicemail as directed and we will return your call as soon as possible. Messages left after 4 pm will be answered the following business day.   You may also send [place] a message via MyChart. We typically respond to MyChart messages within 1-2 business days.  For prescription refills, please ask your pharmacy to contact our office. Our fax number is  [phone].  If you have an urgent issue when the clinic is closed that cannot wait until the next business day, you can page your doctor at the number below.    Please note that while we do our best to be available for urgent issues outside of office hours, we are not available  24/7.   If you have an urgent issue and are unable to reach [place], you may choose to seek medical care at your doctor's office, retail clinic, urgent care center, or emergency room.  If you have a medical emergency, please immediately call 911 or go to the emergency department. In the event of inclement weather, please call our main line at [phone] for an update on the status of any delays or closures.  Dermatology Medication Tips: Please keep the boxes that topical medications come in in order to help keep track of the instructions about where and how to use these. Pharmacies typically print the medication instructions only on the boxes and not directly on the medication tubes.   If your medication is too expensive, please contact our office at [phone] or send [place] a message through MyChart.   We are unable to tell what your co-pay for medications will be in advance as this is different depending on your insurance coverage. However, we may be able to find a substitute medication at lower cost or fill out paperwork to get insurance to cover a needed medication.   If a prior authorization is required to get your medication covered by your insurance company, please allow [place] 1-2 business days to complete this process.  Drug prices often vary depending on where the prescription is filled and some pharmacies may offer cheaper prices.  The website www.goodrx.com contains coupons for medications through different pharmacies. The prices here do not account for what the cost may be with help from insurance (it may be cheaper with your insurance), but the website can give you the price if you did not use any insurance.  - You can print the associated coupon and take it with your prescription to the pharmacy.  - You may also stop by our office during regular business hours and pick up a GoodRx coupon card.  - If you need your prescription sent electronically to a different pharmacy, notify our office  through [facility] or by phone at [phone]

## 2023-04-23 NOTE — Progress Notes (Signed)
   New Patient Visit   Subjective  Debra Santana is a 27 y.o. 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 She says it gets worse with the menses. She last used Tretinoin 0.025% a few weeks ago. She says the insurance no longer covers this med. It was working well on her face.  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 She has had ILK injections which helped.   Previous Dermatologist is [facility].   She declines to undress today.    The following portions of the chart were reviewed this encounter and updated as appropriate: medications, allergies, medical history  Review of Systems:  No other skin or systemic complaints except as noted in HPI or Assessment and Plan.  Objective  Well appearing patient in no apparent distress; mood and affect are within normal limits.  Areas Examined: Face, chest and back  Relevant exam findings are noted in the Assessment and Plan. Right Breast Firm brown dermal papule(s)/plaque(s).     Assessment & Plan   Keloid Right Breast  Intralesional injection - Right Breast Procedure Note Intralesional Injection  Location: right chest, right jaw, left submental chin, posterior neck  Informed Consent: Discussed risks (infection, pain, bleeding, bruising, thinning of the skin, loss of skin pigment, lack of resolution, and recurrence of lesion) and benefits of the procedure, as well as the alternatives. Informed consent was obtained. Preparation: The area was prepared a standard fashion.  Anesthesia:  Procedure Details: An intralesional injection was performed with Kenalog 10 mg/cc. 0.15 cc in total were injected. NDC #: 4166-0630-16 Exp: 10/2024  Total number of injections: 1  Plan: The patient was instructed on post-op care. Recommend OTC analgesia as needed for pain.    ACNE VULGARIS Exam: Open comedones and inflammatory papules on  the face  Flared  Treatment Plan: Clindamycin swabs every morning Retin A 0.05% 2 nights weekly to start. Moisturize before and after application. Advised Neutrogena Hydroboost      Return in about 3 months (around 07/24/2023) for Acne Follow Up.  Jaclynn Guarneri, CMA, am acting as scribe for [employer], DO.   Documentation: I have reviewed the above documentation for accuracy and completeness, and I agree with the above.  Langston Reusing, DO

## 2023-04-24 ENCOUNTER — Ambulatory Visit: Payer: Medicaid Other | Admitting: Dermatology

## 2023-04-24 NOTE — Telephone Encounter (Signed)
Ill call her this morning

## 2023-05-13 DIAGNOSIS — Z419 Encounter for procedure for purposes other than remedying health state, unspecified: Secondary | ICD-10-CM | POA: Diagnosis not present

## 2023-05-15 ENCOUNTER — Ambulatory Visit (INDEPENDENT_AMBULATORY_CARE_PROVIDER_SITE_OTHER): Payer: Medicaid Other | Admitting: Student

## 2023-05-15 ENCOUNTER — Encounter: Payer: Self-pay | Admitting: Student

## 2023-05-15 VITALS — BP 117/77 | HR 81 | Ht 71.0 in | Wt 320.0 lb

## 2023-05-15 DIAGNOSIS — L910 Hypertrophic scar: Secondary | ICD-10-CM | POA: Diagnosis not present

## 2023-05-15 NOTE — Progress Notes (Signed)
   Referring Provider Grayce Sessions, NP [address] [place],  [place] [address]   CC:  Chief Complaint  Patient presents with   Follow-up      Debra Santana is an 27 y.o. female.  HPI: Patient is a 27 year old female who underwent excision of skin lesion to right mandible, and injection of right neck and posterior neck keloids with Dr. Ladona Ridgel on 04/09/2023.  During the procedure, the neck keloid and the posterior neck keloids were injected with a mixture of Kenalog 40 and 1% lidocaine.  Patient was last seen in the clinic on 04/17/2023.  At this visit, patient reported she was doing well.  On exam, right mandibular incision site was intact with Prolene sutures.  These were removed without any difficulty.  Keloids are still present to the right neck and the posterior neck.  Medial aspect of the right mandibular incision was injected with Kenalog 40 and 1% lidocaine.  Plan was for patient to continue to massage keloids and return in 4 weeks.  Today, patient reports she is doing well.  She states that she still feels like she has the keloid to the right mandible and would like a steroid injection to the area today.  She denies any other issues or concerns.  She states that her dermatologist injected her posterior neck keloid with steroids last week, but did not inject her right mandible keloid.  Review of Systems General: Denies any fevers or chills  Physical Exam    05/15/2023    2:22 PM 04/23/2023    3:19 PM 04/17/2023    3:00 PM  Vitals with BMI  Height 5\' 11"     Weight 320 lbs    BMI 44.65    Systolic 117 124 119  Diastolic 77 82 74  Pulse 81  81    General:  No acute distress,  Alert and oriented, Non-Toxic, Normal speech and affect On exam, patient is sitting upright in no acute distress.  To the right mandible, there is a keloid noted.  There is some hyperpigmentation noted throughout the central aspect of the keloid.  There is no surrounding erythema or drainage.   There are no signs of infection on exam.  Assessment/Plan  Keloid scar   I discussed with the patient that if we were to move forward with Kenalog injection, there are risks including further hypopigmentation, infection, and skin thinning.  I discussed with the patient that the hypopigmentation that is present to her keloid at the moment will not resolve and it may worsen with further injections.  Patient expressed understanding and stated that she would still like to move forward with injection.  Kenalog 40 was mixed with lidocaine 1%.  The right mandible keloid was prepped with alcohol.  The keloid was injected with 0.45 cc of the Kenalog 40 and lidocaine 1% mixture.  Patient tolerated well.  I discussed with the patient would like her to continue to massage her keloid daily.  I also recommended she apply silicone based products to the keloid.  Patient expressed understanding.  Patient to follow back up in 4 weeks for reevaluation and possible injection.  I instructed the patient to call in the meantime if she has any questions or concerns about anything.  Laurena Spies 05/15/2023, 4:33 PM

## 2023-06-05 DIAGNOSIS — Z0120 Encounter for dental examination and cleaning without abnormal findings: Secondary | ICD-10-CM | POA: Diagnosis not present

## 2023-06-13 DIAGNOSIS — Z419 Encounter for procedure for purposes other than remedying health state, unspecified: Secondary | ICD-10-CM | POA: Diagnosis not present

## 2023-06-20 ENCOUNTER — Ambulatory Visit: Payer: Medicaid Other | Admitting: Student

## 2023-07-04 ENCOUNTER — Ambulatory Visit (INDEPENDENT_AMBULATORY_CARE_PROVIDER_SITE_OTHER): Payer: Medicaid Other | Admitting: Student

## 2023-07-04 ENCOUNTER — Encounter: Payer: Self-pay | Admitting: Student

## 2023-07-04 VITALS — BP 133/85 | HR 78

## 2023-07-04 DIAGNOSIS — L910 Hypertrophic scar: Secondary | ICD-10-CM

## 2023-07-04 NOTE — Progress Notes (Signed)
   Referring Provider Grayce Sessions, NP [address] [place],  [place] [address]   CC:  Chief Complaint  Patient presents with   Follow-up      Debra Santana is an 27 y.o. female.  HPI: Patient is a 27 year old female who underwent excision of skin lesion to right mandible, and injection of right neck and posterior neck keloids with Dr. Ladona Ridgel on 04/09/2023. During the procedure, the neck keloid and the posterior neck keloids were injected with a mixture of Kenalog 40 and 1% lidocaine.   Patient was last seen in the clinic on 05/15/2023.  At this visit, patient was doing well.  She felt like her mandibular keloid needed another injection of steroids.  Patient reported her dermatologist had injected her neck keloid.  Kenalog 40 mixed with 1% lidocaine was injected into the right mandibular keloid.  Patient tolerated well.  Patient was to follow back up in 4 weeks for reevaluation.  Today, patient reports she is doing well.  She states that she feels the keloid to her right mandible has gotten smaller and has softened.  She is requesting today that the keloid to her right neck to be injected.  She reports she has been massaging her keloids.  Denies any new issues or concerns.  She states that she has an appointment with dermatology regarding the keloid to her posterior neck.  Review of Systems General: Denies any fevers or chills  Physical Exam    07/04/2023   11:38 AM 05/15/2023    2:22 PM 04/23/2023    3:19 PM  Vitals with BMI  Height  5\' 11"    Weight  320 lbs   BMI  44.65   Systolic 133 117 956  Diastolic 85 77 82  Pulse 78 81     General:  No acute distress,  Alert and oriented, Non-Toxic, Normal speech and affect Patient is sitting upright in no acute distress.  Keloid to right mandible appears to have gotten smaller in size.  There is a little bit of hypopigmentation noted centrally to the keloid.  No surrounding erythema or drainage.  There is a keloid to the right  neck.  It is firm.  There is no surrounding erythema or drainage.  No signs of infection on exam.   Discussed with patient risk of injecting Kenalog.  Discussed with her risks include worsening of her hyperpigmentation, hypopigmentation, skin thinning, and infection.  Patient expressed understanding and wanted to move forward with procedure.  Kenalog 40 was mixed with lidocaine 1%.  Keloids were prepped with alcohol.  0.45 cc of the Kenalog/lidocaine mixture was injected into each of the keloids.  Patient tolerated well.  Assessment/Plan  Keloid scar   Discussed with patient to continue to massage her keloids daily.    Patient to follow back up in 4 to 6 weeks.  Instructed patient to call in the meantime she has any questions or concerns about anything.  Debra Santana 07/04/2023, 1:33 PM

## 2023-07-14 DIAGNOSIS — Z419 Encounter for procedure for purposes other than remedying health state, unspecified: Secondary | ICD-10-CM | POA: Diagnosis not present

## 2023-07-29 ENCOUNTER — Ambulatory Visit: Payer: Medicaid Other | Admitting: Dermatology

## 2023-08-07 DIAGNOSIS — K0252 Dental caries on pit and fissure surface penetrating into dentin: Secondary | ICD-10-CM | POA: Diagnosis not present

## 2023-08-12 DIAGNOSIS — K0889 Other specified disorders of teeth and supporting structures: Secondary | ICD-10-CM | POA: Diagnosis not present

## 2023-08-13 DIAGNOSIS — Z419 Encounter for procedure for purposes other than remedying health state, unspecified: Secondary | ICD-10-CM | POA: Diagnosis not present

## 2023-08-14 DIAGNOSIS — Z0120 Encounter for dental examination and cleaning without abnormal findings: Secondary | ICD-10-CM | POA: Diagnosis not present

## 2023-08-16 ENCOUNTER — Ambulatory Visit (INDEPENDENT_AMBULATORY_CARE_PROVIDER_SITE_OTHER): Payer: Medicaid Other | Admitting: Student

## 2023-08-16 DIAGNOSIS — L910 Hypertrophic scar: Secondary | ICD-10-CM | POA: Diagnosis not present

## 2023-08-16 NOTE — Progress Notes (Signed)
   Referring Provider Grayce Sessions, NP [address] [place],  [place] [address]   CC: Follow Up     Debra Santana is an 27 y.o. female.  HPI: Patient is a 27 year old female who underwent excision of skin lesion to the right mandible and injection of right neck and posterior neck keloids with Dr. Ladona Ridgel on 04/09/2023.  Patient presents to the clinic today for further follow-up.  Patient was last seen in the clinic on 07/04/2023.  At this visit, patient reported she was doing well.  She reported that she felt the keloid to her right mandible had gotten smaller and had softened.  She also requested the keloid to her right neck to be injected.  Each of the keloids were injected with a mixture of Kenalog 40 and lidocaine 1%.  Plan is for patient to continue to massage her keloids daily and follow back up with [place].  Today, patient reports she is doing well.  She states that the keloid to her right mandible is still raised and that she would like to have another Kenalog injection today.  She states that the keloid to her right neck has softened up and improved significantly since her last visit.  She states that she still following up with dermatology for the keloid to her posterior neck.  Review of Systems General: Denies any fevers or chills  Physical Exam    07/04/2023   11:38 AM 05/15/2023    2:22 PM 04/23/2023    3:19 PM  Vitals with BMI  Height  5\' 11"    Weight  320 lbs   BMI  44.65   Systolic 133 117 604  Diastolic 85 77 82  Pulse 78 81     General:  No acute distress,  Alert and oriented, Non-Toxic, Normal speech and affect On exam, patient is sitting upright in no acute distress.  The keloid to the right mandible is approximately 3-1/2 cm long.  Keloid to the right neck has softened and resolved.  No signs of infection on exam.  Assessment/Plan  Keloid scar   Discussed risks of injection with the patient including hypopigmentation, infection and skin thinning.   Patient expressed understanding and stated that she wished to move forward with injection today.  Lidocaine 1% and Kenalog 40 were mixed together.  Area was prepped with alcohol.  0.3 cc of the lidocaine Kenalog-40 mixture were injected into the keloid.  Patient tolerated well.  Recommended to patient that she continue to massage her keloid daily.  We also discussed utilization of a silicone based scar cream.  Patient to follow back up with Dr. Ladona Ridgel in 4 to 6 weeks for reevaluation.  I did discuss with the patient that there could be potential of reexcision of the keloid, but we will see how she does after today's injection.  I instructed the patient to call in the meantime she has any questions or concerns about anything.  [name] 08/16/2023, 1:20 PM

## 2023-08-27 DIAGNOSIS — K029 Dental caries, unspecified: Secondary | ICD-10-CM | POA: Diagnosis not present

## 2023-09-13 DIAGNOSIS — Z419 Encounter for procedure for purposes other than remedying health state, unspecified: Secondary | ICD-10-CM | POA: Diagnosis not present

## 2023-09-18 ENCOUNTER — Ambulatory Visit: Payer: Medicaid Other | Admitting: Plastic Surgery

## 2023-09-30 ENCOUNTER — Ambulatory Visit (INDEPENDENT_AMBULATORY_CARE_PROVIDER_SITE_OTHER): Payer: Medicaid Other | Admitting: Plastic Surgery

## 2023-09-30 ENCOUNTER — Telehealth: Payer: Self-pay | Admitting: Radiation Oncology

## 2023-09-30 DIAGNOSIS — E65 Localized adiposity: Secondary | ICD-10-CM

## 2023-09-30 DIAGNOSIS — L989 Disorder of the skin and subcutaneous tissue, unspecified: Secondary | ICD-10-CM

## 2023-09-30 DIAGNOSIS — L910 Hypertrophic scar: Secondary | ICD-10-CM

## 2023-09-30 NOTE — Telephone Encounter (Signed)
Left message for patient to call back to schedule consult per 11/18 referral.

## 2023-09-30 NOTE — Progress Notes (Signed)
Debra Santana returns today for evaluation of her keloids.  She underwent excision of the keloids on the right mandible in May of this year.  She has had steroid injections at least twice since that time and the keloid is not larger than it was when we excised it.  She still has the keloid on the posterior neck as well.  We discussed options for management.  I believe that she should have the keloids excised with postoperative radiation therapy since she has failed excision with steroids.  She has agreed to speak with the radiation oncologist regarding postoperative radiation therapy.  Also has a fullness above the keloid scar on her posterior neck.  I believe this is probably just subcutaneous adipose tissue but we will obtain an ultrasound just to make sure.

## 2023-10-01 NOTE — Progress Notes (Signed)
Location of Concern: Right mandible, Right neck and Posterior Neck  She reports occasional sharp pains in the areas of the keloids.  Past/Anticipated interventions by patient's surgeon/dermatologist for current problematic lesion, if any: Dr. Ladona Ridgel -Keloid excision with postoperative radiation therapy. -No surgical date yet.   Past Keloids, if any: 1) Location/Histology/Intervention: Right Mandible keloid excision and steroid injection 04/09/2023, Injection with Kenalog and lidocaine 08/16/2023 2) Location/Histology/Intervention: Right Neck- Injected with Kenalog and lidocaine 04/09/2023, 07/04/2023 3) Location/Histology/Intervention: Posterior Neck- Injected with Kenalog and lidocaine 04/09/2023    SAFETY ISSUES: Prior radiation? No Pacemaker/ICD? No Possible current pregnancy? Having cycles, She is not sexually active at this time. Is the patient on methotrexate? No  Current Complaints / other details:

## 2023-10-02 NOTE — Progress Notes (Signed)
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   HISTORY OF PRESENT ILLNESS: Debra Santana is a 27 y.o. female seen at the request of Dr. Ladona Ridgel for right mandible and posterior neck keloids.   Patient underwent excision of the keloid on the right mandible in May of 2024. The keloids have recurred and have not responded to at least two steroid injections. She also has a posterior neck keloid that was excised awhile ago by a dermatologist, but has recurred and has not responded to steroid injections. Dr. Ladona Ridgel has recommended removal of both keloids with adjuvant radiation.   She was kindly referred to [place] today to discuss radiation treatment.    PREVIOUS RADIATION THERAPY: No   PAST MEDICAL HISTORY:  Past Medical History:  Diagnosis Date   Asthma        PAST SURGICAL HISTORY: Past Surgical History:  Procedure Laterality Date   TONSILLECTOMY       FAMILY HISTORY: History reviewed. No pertinent family history.   SOCIAL HISTORY:  reports that she has never smoked. She has never used smokeless tobacco. She reports that she does not currently use alcohol. She reports that she does not currently use drugs.   ALLERGIES: Patient has no known allergies.   MEDICATIONS:  Current Outpatient Medications  Medication Sig Dispense Refill   cetirizine (ZYRTEC) 10 MG tablet 1 each 1 (one) time each day.     clindamycin (CLEOCIN T) 1 % SWAB Apply 1 Application topically daily. 60 each 2   fluticasone (FLONASE) 50 MCG/ACT nasal spray SPRAY TWO SPRAYS IN EACH NOSTRIL ONCE DAILY 16 mL 0   ibuprofen (ADVIL) 800 MG tablet Take 800 mg by mouth every 6 (six) hours as needed.     RETIN-A 0.05 % cream Apply topically at  bedtime. Apply a thin layer 2 nights per week 45 g 2   triamcinolone (KENALOG) 0.1 % Apply 1 application topically 2 (two) times daily. 30 g 0   No current facility-administered medications for this encounter.     REVIEW OF SYSTEMS: Patient states she is doing well overall today. She notes that her keloids are painful, causing her to use Ibuprofen at times to alleviate the pain. She denies any bleeding.      PHYSICAL EXAM:  Wt Readings from Last 3 Encounters:  10/04/23 (!) 348 lb 6 oz (158 kg)  05/15/23 (!) 320 lb (145.2 kg)  02/27/23 (!) 355 lb 3.2 oz (161.1 kg)   Temp Readings from Last 3 Encounters:  10/04/23 (!) 96.6 F (35.9 C) (Temporal)  12/26/20 (!) 97.3 F (36.3 C) (Temporal)  10/06/19 98.2 F (36.8 C) (Oral)   BP Readings from Last 3 Encounters:  10/04/23 115/85  07/04/23 133/85  05/15/23 117/77   Pulse Readings from Last 3 Encounters:  10/04/23 94  07/04/23 78  05/15/23 81   Pain Assessment Pain Score: 0-No pain/10  In general this is a well appearing female in no acute distress. She's alert and oriented x4 and appropriate throughout the examination. Cardiopulmonary assessment is negative for acute distress and she exhibits normal effort. Keloids visible on right mandible and posterior neck. See pictures below.  ECOG = 0  0 - Asymptomatic (Fully active, able to carry on all predisease activities without restriction)  1 - Symptomatic but completely ambulatory (Restricted in physically strenuous activity but ambulatory and able to carry out work of a light or sedentary nature. For example, light housework, office work)  2 - Symptomatic, <50% in bed during the day (Ambulatory and capable of all self care but unable to carry out any work activities. Up and about more than 50% of waking hours)  3 - Symptomatic, >50% in bed, but not bedbound (Capable of only limited self-care, confined to bed or chair 50% or more of waking hours)  4 - Bedbound  (Completely disabled. Cannot carry on any self-care. Totally confined to bed or chair)  5 - Death   [name] MM, Creech RH, Tormey DC, et al. [phone]). "Toxicity and response criteria of the [facility]". Am. Evlyn Clines. Oncol. 5 (6): 649-55    LABORATORY DATA:  Lab Results  Component Value Date   WBC 6.4 01/29/2023   HGB 11.5 01/29/2023   HCT 37.1 01/29/2023   MCV 79 01/29/2023   PLT 428 01/29/2023   Lab Results  Component Value Date   NA 139 01/29/2023   K 5.1 01/29/2023   CL 101 01/29/2023   CO2 22 01/29/2023   Lab Results  Component Value Date   ALT 9 01/29/2023   AST 16 01/29/2023   ALKPHOS 98 01/29/2023   BILITOT 0.3 01/29/2023      RADIOGRAPHY: No results found.     IMPRESSION/PLAN: 1. Right mandible and posterior neck keloids   We have reviewed the patient's case. She has not responded to alternative treatments and is undergoing surgery to these areas. Dr. Mitzi Hansen recommends radiation therapy to decrease her risk of local keloid recurrence.   Today, I talked to the patient about the findings and work-up thus far.  We discussed the natural history of keloids and general treatment, highlighting the role of radiotherapy in the management.  We discussed the available radiation techniques, and focused on the details of logistics and delivery.  We reviewed the anticipated acute and late sequelae associated with radiation in this setting.  The patient was encouraged to ask questions that I answered to the best of my ability.  A patient consent form was discussed and signed.  We retained a copy for our records.    Patient's surgery has not yet been scheduled. We will plan to have her come in for CT simulation on the same day as her surgery. We anticipate administering 4 fractions of radiation to the excision site, beginning the day after her surgery.  Patient was informed about the risks associated with pregnancy during radiation therapy and understands the  importance of notifying the team if there are any concerns about a potential pregnancy. She will complete an at-home pregnancy test the day before her CT simulation and confirm a negative result with our staff before proceeding with treatment planning.   In a visit lasting 60 minutes, greater than 50% of the time was spent face to face discussing the patient's condition, in preparation for the discussion, and coordinating the patient's care.   The above documentation reflects my direct findings during this shared patient visit. Please see the separate note by Dr. Mitzi Hansen on this date for the remainder of the patient's plan of care.    Joyice Faster, PA-C    **Disclaimer: This note was dictated with voice recognition software. Similar sounding words can inadvertently  be transcribed and this note may contain transcription errors which may not have been corrected upon publication of note.**

## 2023-10-04 ENCOUNTER — Encounter: Payer: Self-pay | Admitting: Radiation Oncology

## 2023-10-04 ENCOUNTER — Ambulatory Visit
Admission: RE | Admit: 2023-10-04 | Discharge: 2023-10-04 | Disposition: A | Discharge status: Home / Self Care | Payer: Medicaid Other | Source: Ambulatory Visit | Attending: Radiation Oncology | Admitting: Radiation Oncology

## 2023-10-04 VITALS — BP 115/85 | HR 94 | Temp 96.6°F | Resp 18 | Ht 71.0 in | Wt 348.4 lb

## 2023-10-04 DIAGNOSIS — L910 Hypertrophic scar: Secondary | ICD-10-CM | POA: Diagnosis not present

## 2023-10-04 DIAGNOSIS — J45909 Unspecified asthma, uncomplicated: Secondary | ICD-10-CM | POA: Diagnosis not present

## 2023-10-07 ENCOUNTER — Telehealth: Payer: Self-pay | Admitting: Plastic Surgery

## 2023-10-07 NOTE — Telephone Encounter (Signed)
Patient said that the radiologist said that they are ready to go as soon as she books the procedure for keloid on face and back of neck with you, that way they can be done same day.

## 2023-10-16 NOTE — Telephone Encounter (Signed)
Patient called again about removal of keloid procedure. Dr Ladona Ridgel was in the middle of a procedure so I could not ask him if it was okay to book. She says she would like to sch procedure before the end of Dec because her insurance runs out.

## 2023-10-17 NOTE — Telephone Encounter (Signed)
Hi Anaya,   Please follow up with Dr. Ladona Ridgel in regards to this as patient has to have radiation set up before we can schedule. I added Heather on this too but I do not think there are going to be any openings prior to the end of the year if the patient needs this done in the OR. But again, please follow up with Dr. Ladona Ridgel on this as he might be able to do it in the clinic.   Thanks, Alan Ripper

## 2023-10-21 ENCOUNTER — Telehealth: Payer: Self-pay | Admitting: Radiation Oncology

## 2023-10-21 ENCOUNTER — Ambulatory Visit (INDEPENDENT_AMBULATORY_CARE_PROVIDER_SITE_OTHER): Payer: Self-pay | Admitting: Plastic Surgery

## 2023-10-21 ENCOUNTER — Encounter: Payer: Self-pay | Admitting: Plastic Surgery

## 2023-10-21 VITALS — BP 160/102 | HR 82

## 2023-10-21 DIAGNOSIS — L910 Hypertrophic scar: Secondary | ICD-10-CM

## 2023-10-21 NOTE — Addendum Note (Signed)
Addended by: Sherol Dade on: 10/21/2023 05:20 PM   Modules accepted: Orders

## 2023-10-21 NOTE — Telephone Encounter (Signed)
12/9 @ 2:35 pm Received call from Dr. Lubertha Basque office spoke to [place].  Request patient to be schedule same day with Dr. Mitzi Hansen after her procedure appointment on 12/19 at 12:30 pm.  Waiting on referral before schedule.

## 2023-10-21 NOTE — Progress Notes (Signed)
Patient brought in for evaluation of keloid on posterior neck.  I believe I can do this in the office under local.  Will schedule before 31 December.

## 2023-10-22 ENCOUNTER — Telehealth: Payer: Self-pay | Admitting: Plastic Surgery

## 2023-10-22 NOTE — Telephone Encounter (Signed)
That is a question for Dr Mitzi Hansen at Radiation Oncology

## 2023-10-22 NOTE — Telephone Encounter (Signed)
Patients states that she is having a tooth pulled out on Tuesday 12.17.24, but on .the same side as keloid, she has an appointment to have keloid removed on 12.19.24 Thursday. She would like to know if it'll still be ok to have her keloid removed and still do radiation treatment, if not she will move the tooth pulling to another time and day.

## 2023-10-28 ENCOUNTER — Telehealth: Payer: Self-pay | Admitting: Radiation Oncology

## 2023-10-28 NOTE — Telephone Encounter (Signed)
12/16 Follow up consult not needed just need to setup ct sim/treatments only -per Dr. Romero Belling.  Email sent to Support RTT/CT sim, and also called and spoke to [facility] - Dr. Ladona Ridgel office, so they are aware

## 2023-10-31 ENCOUNTER — Ambulatory Visit
Admission: RE | Admit: 2023-10-31 | Discharge: 2023-10-31 | Disposition: A | Discharge status: Home / Self Care | Payer: Medicaid Other | Source: Ambulatory Visit | Attending: Radiation Oncology | Admitting: Radiation Oncology

## 2023-10-31 ENCOUNTER — Ambulatory Visit: Payer: Medicaid Other | Admitting: Radiation Oncology

## 2023-10-31 ENCOUNTER — Ambulatory Visit: Payer: Medicaid Other | Admitting: Plastic Surgery

## 2023-10-31 ENCOUNTER — Other Ambulatory Visit: Payer: Self-pay | Admitting: Plastic Surgery

## 2023-10-31 ENCOUNTER — Ambulatory Visit: Payer: Medicaid Other

## 2023-10-31 DIAGNOSIS — L910 Hypertrophic scar: Secondary | ICD-10-CM

## 2023-10-31 DIAGNOSIS — Z510 Encounter for antineoplastic radiation therapy: Secondary | ICD-10-CM | POA: Diagnosis not present

## 2023-10-31 NOTE — Progress Notes (Signed)
Procedure Note  Preoperative Dx: Keloids x 2, posterior neck, right mandible  Postoperative Dx: Same  Procedure: Excision of keloids x 2  Anesthesia: Lidocaine 1% with 1:100,000 epinephrine and 0.25% Sensorcaine   Indication for Procedure: Removal due to pain and for pathologic diagnosis.  Description of Procedure: Risks and complications were explained to the patient including recurrence.  Consent was confirmed and the patient understands the risks and benefits.  The potential complications and alternatives were explained and the patient consents.  The patient expressed understanding the option of not having the procedure and the risks of a scar.  Time out was called and all information was confirmed to be correct.    The area was prepped and drapped.  Local anesthetic was injected in the subcutaneous tissues.  After waiting for the local to take affect the posterior neck keloid was excised first.  The keloid measured approximately 3 cm in length.  A 5 cm incision was made around the keloid and the keloid was excised completely and sent to pathology for routine examination.  The wound was then closed with interrupted 4-0 Monocryl sutures in the dermis and a running 4-0 Prolene suture and the skin.  Attention was turned to the right mandible.  The right mandibular keloid was approximately 2 cm in length and a 3 cm incision was made around the keloid and the keloid was excised completely.  The incision was closed with interrupted 4-0 Monocryl sutures in the dermis and interrupted 5-0 Prolene sutures in the skin. .  The surgical wound measured 5 cm and 3 cm respectively.  A dressing was applied.  The patient was given instructions on how to care for the area and a follow up appointment.  She also had an appointment in radiation oncology for post procedure radiation therapy Jenalyn tolerated the procedure well and there were no complications. The specimen was sent to pathology.

## 2023-11-01 ENCOUNTER — Other Ambulatory Visit: Payer: Self-pay

## 2023-11-01 ENCOUNTER — Ambulatory Visit
Admission: RE | Admit: 2023-11-01 | Discharge: 2023-11-01 | Disposition: A | Discharge status: Home / Self Care | Payer: Medicaid Other | Source: Ambulatory Visit | Attending: Radiation Oncology | Admitting: Radiation Oncology

## 2023-11-01 DIAGNOSIS — L910 Hypertrophic scar: Secondary | ICD-10-CM | POA: Diagnosis not present

## 2023-11-01 DIAGNOSIS — Z510 Encounter for antineoplastic radiation therapy: Secondary | ICD-10-CM | POA: Diagnosis not present

## 2023-11-01 LAB — RAD ONC ARIA SESSION SUMMARY
Course Elapsed Days: 0
Plan Fractions Treated to Date: 1
Plan Fractions Treated to Date: 1
Plan Prescribed Dose Per Fraction: 4 Gy
Plan Prescribed Dose Per Fraction: 4 Gy
Plan Total Fractions Prescribed: 4
Plan Total Fractions Prescribed: 4
Plan Total Prescribed Dose: 16 Gy
Plan Total Prescribed Dose: 16 Gy
Reference Point Dosage Given to Date: 4 Gy
Reference Point Dosage Given to Date: 4 Gy
Reference Point Session Dosage Given: 4 Gy
Reference Point Session Dosage Given: 4 Gy
Session Number: 1

## 2023-11-04 ENCOUNTER — Other Ambulatory Visit: Payer: Self-pay

## 2023-11-04 ENCOUNTER — Ambulatory Visit
Admission: RE | Admit: 2023-11-04 | Discharge: 2023-11-04 | Disposition: A | Discharge status: Home / Self Care | Payer: Medicaid Other | Source: Ambulatory Visit | Attending: Radiation Oncology | Admitting: Radiation Oncology

## 2023-11-04 DIAGNOSIS — Z510 Encounter for antineoplastic radiation therapy: Secondary | ICD-10-CM | POA: Diagnosis not present

## 2023-11-04 DIAGNOSIS — L910 Hypertrophic scar: Secondary | ICD-10-CM | POA: Diagnosis not present

## 2023-11-04 LAB — RAD ONC ARIA SESSION SUMMARY
Course Elapsed Days: 3
Plan Fractions Treated to Date: 2
Plan Fractions Treated to Date: 2
Plan Prescribed Dose Per Fraction: 4 Gy
Plan Prescribed Dose Per Fraction: 4 Gy
Plan Total Fractions Prescribed: 4
Plan Total Fractions Prescribed: 4
Plan Total Prescribed Dose: 16 Gy
Plan Total Prescribed Dose: 16 Gy
Reference Point Dosage Given to Date: 8 Gy
Reference Point Dosage Given to Date: 8 Gy
Reference Point Session Dosage Given: 4 Gy
Reference Point Session Dosage Given: 4 Gy
Session Number: 2

## 2023-11-05 ENCOUNTER — Other Ambulatory Visit: Payer: Self-pay

## 2023-11-05 ENCOUNTER — Ambulatory Visit: Payer: Medicaid Other

## 2023-11-05 ENCOUNTER — Ambulatory Visit
Admission: RE | Admit: 2023-11-05 | Discharge: 2023-11-05 | Disposition: A | Discharge status: Home / Self Care | Payer: Medicaid Other | Source: Ambulatory Visit | Attending: Radiation Oncology

## 2023-11-05 DIAGNOSIS — L910 Hypertrophic scar: Secondary | ICD-10-CM | POA: Diagnosis not present

## 2023-11-05 DIAGNOSIS — Z510 Encounter for antineoplastic radiation therapy: Secondary | ICD-10-CM | POA: Diagnosis not present

## 2023-11-05 LAB — RAD ONC ARIA SESSION SUMMARY
Course Elapsed Days: 4
Plan Fractions Treated to Date: 3
Plan Fractions Treated to Date: 3
Plan Prescribed Dose Per Fraction: 4 Gy
Plan Prescribed Dose Per Fraction: 4 Gy
Plan Total Fractions Prescribed: 4
Plan Total Fractions Prescribed: 4
Plan Total Prescribed Dose: 16 Gy
Plan Total Prescribed Dose: 16 Gy
Reference Point Dosage Given to Date: 12 Gy
Reference Point Dosage Given to Date: 12 Gy
Reference Point Session Dosage Given: 4 Gy
Reference Point Session Dosage Given: 4 Gy
Session Number: 3

## 2023-11-07 ENCOUNTER — Other Ambulatory Visit: Payer: Self-pay

## 2023-11-07 ENCOUNTER — Ambulatory Visit
Admission: RE | Admit: 2023-11-07 | Discharge: 2023-11-07 | Disposition: A | Discharge status: Home / Self Care | Payer: Medicaid Other | Source: Ambulatory Visit | Attending: Radiation Oncology | Admitting: Radiation Oncology

## 2023-11-07 ENCOUNTER — Ambulatory Visit: Payer: Medicaid Other | Admitting: Physician Assistant

## 2023-11-07 ENCOUNTER — Ambulatory Visit: Payer: Medicaid Other | Admitting: Dermatology

## 2023-11-07 DIAGNOSIS — L910 Hypertrophic scar: Secondary | ICD-10-CM | POA: Diagnosis not present

## 2023-11-07 DIAGNOSIS — Z510 Encounter for antineoplastic radiation therapy: Secondary | ICD-10-CM | POA: Diagnosis not present

## 2023-11-07 LAB — RAD ONC ARIA SESSION SUMMARY
Course Elapsed Days: 6
Plan Fractions Treated to Date: 4
Plan Fractions Treated to Date: 4
Plan Prescribed Dose Per Fraction: 4 Gy
Plan Prescribed Dose Per Fraction: 4 Gy
Plan Total Fractions Prescribed: 4
Plan Total Fractions Prescribed: 4
Plan Total Prescribed Dose: 16 Gy
Plan Total Prescribed Dose: 16 Gy
Reference Point Dosage Given to Date: 16 Gy
Reference Point Dosage Given to Date: 16 Gy
Reference Point Session Dosage Given: 4 Gy
Reference Point Session Dosage Given: 4 Gy
Session Number: 4

## 2023-11-07 LAB — DERMATOLOGY PATHOLOGY

## 2023-11-07 NOTE — Progress Notes (Signed)
Patient is a pleasant 27 year old female s/p excision of keloid from posterior neck and right mandibular region performed 10/31/2023 by Dr. Ladona Ridgel who presents to clinic for postprocedural follow-up.  Reviewed procedure note and the excision sites were closed with combination of running and interrupted Prolene sutures.  She then proceeded with radiation treatment x 3 days.  Today, patient is doing well.  Reports mild drainage from the posterior neck excision site, but otherwise no complaints.  On exam, each of the excision sites appear to be well-approximated and healing nicely.  All of the Prolene sutures are removed in their entirety without complication or difficulty.  No surrounding erythema or induration otherwise concerning for infection.  Recommending Vaseline to the excision sites once daily x 1 week and then transition to silicone scar gel twice daily x 3 months along with mechanical massage.  Follow-up only as needed.  Patient voices understanding and is agreeable to the plan.

## 2023-11-08 NOTE — Radiation Completion Notes (Addendum)
  Radiation Oncology         [phone] ________________________________  Name: Debra Santana MRN: [id number]  Date of Service: 11/07/2023  DOB: [birth year]  End of Treatment Note    Diagnosis:   Right mandible and posterior neck keloids   Intent: Curative     ==========DELIVERED PLANS==========  First Treatment Date: 2023-11-01 Last Treatment Date: 2023-11-07   Plan Name: HN_R_Mand_BO Site: Mandible Technique: Electron Mode: Electron Dose Per Fraction: 4 Gy Prescribed Dose (Delivered / Prescribed): 16 Gy / 16 Gy Prescribed Fxs (Delivered / Prescribed): 4 / 4   Plan Name: HN_Post_BO Site: Neck Technique: Electron Mode: Electron Dose Per Fraction: 4 Gy Prescribed Dose (Delivered / Prescribed): 16 Gy / 16 Gy Prescribed Fxs (Delivered / Prescribed): 4 / 4     ==========ON TREATMENT VISIT DATES========== 2023-11-05   See weekly On Treatment Notes in Epic for details in the Media tab (listed as Progress notes on the On Treatment Visit Dates listed above). The patient tolerated radiation.   The patient will receive a call in about one month from the radiation oncology department. She will continue follow up with Dr. Ladona Ridgel as well.      Osker Mason, PAC

## 2023-11-27 ENCOUNTER — Ambulatory Visit: Payer: Medicaid Other | Admitting: Dermatology

## 2023-12-09 ENCOUNTER — Ambulatory Visit
Admission: RE | Admit: 2023-12-09 | Discharge: 2023-12-09 | Disposition: A | Discharge status: Home / Self Care | Payer: Medicaid Other | Source: Ambulatory Visit | Attending: Radiation Oncology | Admitting: Radiation Oncology

## 2023-12-09 DIAGNOSIS — Z510 Encounter for antineoplastic radiation therapy: Secondary | ICD-10-CM | POA: Insufficient documentation

## 2023-12-09 DIAGNOSIS — L910 Hypertrophic scar: Secondary | ICD-10-CM | POA: Insufficient documentation

## 2023-12-09 NOTE — Progress Notes (Signed)
  Radiation Oncology         [phone] ________________________________  Name: Debra Santana MRN: [id number]  Date of Service: 12/09/2023  DOB: 01/04/1996  Post Treatment Telephone Note  Diagnosis:  Right mandible and posterior neck keloids  (as documented in provider EOT note)  The patient was not available for call today.   The patient has scheduled follow up with her medical oncologist Dr. Ladona Ridgel for ongoing surveillance, and with Dr. Basilio Cairo. The patient was encouraged to call if she develops concerns or questions regarding radiation.    Ruel Favors, LPN

## 2024-02-25 ENCOUNTER — Ambulatory Visit: Payer: Medicaid Other | Admitting: Dermatology

## 2024-04-13 ENCOUNTER — Ambulatory Visit: Admitting: Dermatology

## 2024-11-17 DIAGNOSIS — Z0120 Encounter for dental examination and cleaning without abnormal findings: Secondary | ICD-10-CM | POA: Diagnosis not present

## 2024-11-19 ENCOUNTER — Other Ambulatory Visit: Payer: Self-pay

## 2024-11-19 ENCOUNTER — Ambulatory Visit: Admitting: Dermatology

## 2024-11-19 ENCOUNTER — Telehealth: Payer: Self-pay | Admitting: Dermatology

## 2024-11-19 DIAGNOSIS — L700 Acne vulgaris: Secondary | ICD-10-CM

## 2024-11-19 MED ORDER — CLINDAMYCIN PHOSPHATE 1 % EX SWAB: 1.0000 | Freq: Every day | CUTANEOUS | 0 refills | Status: AC

## 2024-11-19 NOTE — Telephone Encounter (Signed)
 Pt had to reschedule her appointment today due to an interview she had to get to and provider running behind. Pt has been r/s for 01/20/25. She would like to know if she can receive a refill on clindamycin  (CLEOCIN  T) 1% swab, until her next appt. Please advise at: [phone].

## 2024-11-19 NOTE — Telephone Encounter (Signed)
 Hi Jetta, Please send once rx with no refill for her clindamycin  swabs.  Thanks :)

## 2024-12-18 ENCOUNTER — Telehealth: Payer: Self-pay | Admitting: Primary Care

## 2024-12-18 NOTE — Telephone Encounter (Signed)
 Pt has requested TOC from Debra Johnie Bohr, NP to [facility]. Please advise, thank you. -aw

## 2024-12-28 ENCOUNTER — Encounter: Admitting: Family

## 2025-01-20 ENCOUNTER — Ambulatory Visit: Admitting: Dermatology
